# Patient Record
Sex: Female | Born: 1980 | Race: White | Hispanic: No | Smoking: Current every day smoker
Health system: Southern US, Community
[De-identification: ages and names within clinical notes are randomized; demographics above are authoritative.]

## PROBLEM LIST (undated history)

## (undated) DIAGNOSIS — F1010 Alcohol abuse, uncomplicated: Secondary | ICD-10-CM

## (undated) HISTORY — PX: NEPHRECTOMY TRANSPLANTED ORGAN: SUR880

## (undated) HISTORY — PX: MITRAL VALVE REPLACEMENT: SHX147

---

## 2020-02-29 NOTE — Progress Notes (Deleted)
Cardiology Office Note:   Date:  02/29/2020  NAME:  Leah Roth    MRN: [id number] DOB:  27-Oct-1980   PCP:  No primary care provider on file.  Cardiologist:  No primary care provider on file.  Electrophysiologist:  None   Referring MD: Shanna Cisco, NP   No chief complaint on file. ***  History of Present Illness:   Leah Roth is a 40 y.o. female with a hx of ESRD s/p transplant, mechanical MVR who is being seen today for the evaluation of MVR at the request of Cullop, Adele Barthel, NP.  Problem List 1. 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 MV endocarditis s/p mechanical MVR  -MG 2017 8.8 mmHG  Past Medical History: No past medical history on file.  Past Surgical History: *** The histories are not reviewed yet. Please review them in the "History" navigator section and refresh this SmartLink.  Current Medications: No outpatient medications have been marked as taking for the 03/02/20 encounter (Appointment) with O'Neal, Ronnald Ramp, MD.     Allergies:    Patient has no allergy information on record.   Social History: Social History   Socioeconomic History  . Marital status: Not on file    Spouse name: Not on file  . Number of children: Not on file  . Years of education: Not on file  . Highest education level: Not on file  Occupational History  . Not on file  Tobacco Use  . Smoking status: Not on file  . Smokeless tobacco: Not on file  Substance and Sexual Activity  . Alcohol use: Not on file  . Drug use: Not on file  . Sexual activity: Not on file  Other Topics Concern  . Not on file  Social History Narrative  . Not on file   Social Determinants of Health   Financial Resource Strain: Not on file  Food Insecurity: Not on file  Transportation Needs: Not on file  Physical Activity: Not on file  Stress: Not on file  Social Connections: Not on file     Family History: The patient's ***family history is not on file.  ROS:   All  other ROS reviewed and negative. Pertinent positives noted in the HPI.     EKGs/Labs/Other Studies Reviewed:   The following studies were personally reviewed by me today:  EKG:  EKG is *** ordered today.  The ekg ordered today demonstrates ***, and was personally reviewed by me.   TTE 07/26/2015  SUMMARY  The left ventricular size is normal.  The left ventricular wall motion is normal.  The right ventricle is normal size.  The right ventricular systolic function is normal.  The left atrial size is normal.   Right atrial size is normal.  Structurally normal aortic valve.  Mitral regurgitation present but unable to estimate severity due to  prosthetic valve shadowing  There is a bi-leaflet ([employer]) mechanical prosthesis  There is trace tricuspid regurgitation.  Structurally normal pulmonic valve.  Trace pulmonic valvular regurgitation.  There is no pericardial effusion.  Mild increase in the transmitral gradient relative to the prior study from  02/08/12.    Recent Labs: No results found for requested labs within last 8760 hours.   Recent Lipid Panel No results found for: CHOL, TRIG, HDL, CHOLHDL, VLDL, LDLCALC, LDLDIRECT  Physical Exam:   VS:  There were no vitals taken for this visit.   Wt Readings from Last 3 Encounters:  No data found for [name]  General: Well nourished, well developed, in no acute distress Head: Atraumatic, normal size  Eyes: PEERLA, EOMI  Neck: Supple, no JVD Endocrine: No thryomegaly Cardiac: Normal S1, S2; RRR; no murmurs, rubs, or gallops Lungs: Clear to auscultation bilaterally, no wheezing, rhonchi or rales  Abd: Soft, nontender, no hepatomegaly  Ext: No edema, pulses 2+ Musculoskeletal: No deformities, BUE and BLE strength normal and equal Skin: Warm and dry, no rashes   Neuro: Alert and oriented to person, place, time, and situation, CNII-XII grossly intact, no focal deficits  Psych: Normal mood and affect   ASSESSMENT:   Leah Roth is  a 40 y.o. female who presents for the following: No diagnosis found.  PLAN:   There are no diagnoses linked to this encounter.  Disposition: No follow-ups on file.  Medication Adjustments/Labs and Tests Ordered: Current medicines are reviewed at length with the patient today.  Concerns regarding medicines are outlined above.  No orders of the defined types were placed in this encounter.  No orders of the defined types were placed in this encounter.   There are no Patient Instructions on file for this visit.   Time Spent with Patient: I have spent a total of *** minutes with patient reviewing hospital notes, telemetry, EKGs, labs and examining the patient as well as establishing an assessment and plan that was discussed with the patient.  > 50% of time was spent in direct patient care.  Signed, Lenna Gilford. Flora Lipps, MD [facility]  [address], [address] [place], [place] [address] [phone]  02/29/2020 4:58 PM

## 2020-03-02 ENCOUNTER — Ambulatory Visit: Payer: Medicare Other | Admitting: Cardiovascular Disease

## 2020-03-02 DIAGNOSIS — Z952 Presence of prosthetic heart valve: Secondary | ICD-10-CM

## 2020-03-15 ENCOUNTER — Encounter: Payer: Self-pay | Admitting: General Practice

## 2020-03-16 ENCOUNTER — Emergency Department (HOSPITAL_COMMUNITY): Payer: Medicare Other

## 2020-03-16 ENCOUNTER — Encounter (HOSPITAL_COMMUNITY): Payer: Self-pay

## 2020-03-16 ENCOUNTER — Other Ambulatory Visit: Payer: Self-pay

## 2020-03-16 ENCOUNTER — Inpatient Hospital Stay (HOSPITAL_COMMUNITY)
Admission: EM | Admit: 2020-03-16 | Discharge: 2020-03-19 | DRG: 948 | Disposition: A | Discharge status: Home / Self Care | Payer: Medicare Other | Attending: Internal Medicine | Admitting: Internal Medicine

## 2020-03-16 DIAGNOSIS — W19XXXA Unspecified fall, initial encounter: Secondary | ICD-10-CM

## 2020-03-16 DIAGNOSIS — R7401 Elevation of levels of liver transaminase levels: Secondary | ICD-10-CM | POA: Diagnosis present

## 2020-03-16 DIAGNOSIS — D62 Acute posthemorrhagic anemia: Secondary | ICD-10-CM | POA: Diagnosis present

## 2020-03-16 DIAGNOSIS — Z888 Allergy status to other drugs, medicaments and biological substances status: Secondary | ICD-10-CM

## 2020-03-16 DIAGNOSIS — F10229 Alcohol dependence with intoxication, unspecified: Secondary | ICD-10-CM | POA: Diagnosis present

## 2020-03-16 DIAGNOSIS — M87852 Other osteonecrosis, left femur: Secondary | ICD-10-CM | POA: Diagnosis present

## 2020-03-16 DIAGNOSIS — Z79899 Other long term (current) drug therapy: Secondary | ICD-10-CM

## 2020-03-16 DIAGNOSIS — Z20822 Contact with and (suspected) exposure to covid-19: Secondary | ICD-10-CM | POA: Diagnosis present

## 2020-03-16 DIAGNOSIS — F32A Depression, unspecified: Secondary | ICD-10-CM | POA: Diagnosis present

## 2020-03-16 DIAGNOSIS — F1010 Alcohol abuse, uncomplicated: Secondary | ICD-10-CM | POA: Diagnosis present

## 2020-03-16 DIAGNOSIS — Z952 Presence of prosthetic heart valve: Secondary | ICD-10-CM

## 2020-03-16 DIAGNOSIS — W109XXA Fall (on) (from) unspecified stairs and steps, initial encounter: Secondary | ICD-10-CM | POA: Diagnosis present

## 2020-03-16 DIAGNOSIS — Z91199 Patient's noncompliance with other medical treatment and regimen due to unspecified reason: Secondary | ICD-10-CM

## 2020-03-16 DIAGNOSIS — F17210 Nicotine dependence, cigarettes, uncomplicated: Secondary | ICD-10-CM | POA: Diagnosis present

## 2020-03-16 DIAGNOSIS — Z9119 Patient's noncompliance with other medical treatment and regimen: Secondary | ICD-10-CM

## 2020-03-16 DIAGNOSIS — R791 Abnormal coagulation profile: Principal | ICD-10-CM | POA: Diagnosis present

## 2020-03-16 DIAGNOSIS — M25461 Effusion, right knee: Secondary | ICD-10-CM | POA: Diagnosis present

## 2020-03-16 DIAGNOSIS — Y906 Blood alcohol level of 120-199 mg/100 ml: Secondary | ICD-10-CM | POA: Diagnosis present

## 2020-03-16 DIAGNOSIS — Z96641 Presence of right artificial hip joint: Secondary | ICD-10-CM | POA: Diagnosis present

## 2020-03-16 DIAGNOSIS — G319 Degenerative disease of nervous system, unspecified: Secondary | ICD-10-CM | POA: Diagnosis present

## 2020-03-16 DIAGNOSIS — G621 Alcoholic polyneuropathy: Secondary | ICD-10-CM | POA: Diagnosis present

## 2020-03-16 DIAGNOSIS — Z5329 Procedure and treatment not carried out because of patient's decision for other reasons: Secondary | ICD-10-CM | POA: Diagnosis present

## 2020-03-16 DIAGNOSIS — Y92009 Unspecified place in unspecified non-institutional (private) residence as the place of occurrence of the external cause: Secondary | ICD-10-CM

## 2020-03-16 DIAGNOSIS — Z940 Kidney transplant status: Secondary | ICD-10-CM

## 2020-03-16 DIAGNOSIS — S0003XA Contusion of scalp, initial encounter: Secondary | ICD-10-CM | POA: Diagnosis present

## 2020-03-16 DIAGNOSIS — N3001 Acute cystitis with hematuria: Secondary | ICD-10-CM

## 2020-03-16 DIAGNOSIS — Z7901 Long term (current) use of anticoagulants: Secondary | ICD-10-CM

## 2020-03-16 DIAGNOSIS — T07XXXA Unspecified multiple injuries, initial encounter: Secondary | ICD-10-CM

## 2020-03-16 DIAGNOSIS — B9620 Unspecified Escherichia coli [E. coli] as the cause of diseases classified elsewhere: Secondary | ICD-10-CM | POA: Diagnosis present

## 2020-03-16 HISTORY — DX: Alcohol abuse, uncomplicated: F10.10

## 2020-03-16 LAB — CBC WITH DIFFERENTIAL/PLATELET
Abs Immature Granulocytes: 0.06 10*3/uL (ref 0.00–0.07)
Basophils Absolute: 0 10*3/uL (ref 0.0–0.1)
Basophils Relative: 0 %
Eosinophils Absolute: 0.1 10*3/uL (ref 0.0–0.5)
Eosinophils Relative: 1 %
HCT: 26.2 % — ABNORMAL LOW (ref 36.0–46.0)
Hemoglobin: 8.8 g/dL — ABNORMAL LOW (ref 12.0–15.0)
Immature Granulocytes: 1 %
Lymphocytes Relative: 26 %
Lymphs Abs: 1.8 10*3/uL (ref 0.7–4.0)
MCH: 31.7 pg (ref 26.0–34.0)
MCHC: 33.6 g/dL (ref 30.0–36.0)
MCV: 94.2 fL (ref 80.0–100.0)
Monocytes Absolute: 0.6 10*3/uL (ref 0.1–1.0)
Monocytes Relative: 8 %
Neutro Abs: 4.4 10*3/uL (ref 1.7–7.7)
Neutrophils Relative %: 64 %
Platelets: 152 10*3/uL (ref 150–400)
RBC: 2.78 MIL/uL — ABNORMAL LOW (ref 3.87–5.11)
RDW: 20.1 % — ABNORMAL HIGH (ref 11.5–15.5)
WBC: 6.9 10*3/uL (ref 4.0–10.5)
nRBC: 0.4 % — ABNORMAL HIGH (ref 0.0–0.2)

## 2020-03-16 LAB — URINALYSIS, ROUTINE W REFLEX MICROSCOPIC
Bilirubin Urine: NEGATIVE
Glucose, UA: NEGATIVE mg/dL
Ketones, ur: 5 mg/dL — AB
Nitrite: NEGATIVE
Protein, ur: 100 mg/dL — AB
Specific Gravity, Urine: 1.018 (ref 1.005–1.030)
pH: 5 (ref 5.0–8.0)

## 2020-03-16 LAB — COMPREHENSIVE METABOLIC PANEL
ALT: 81 U/L — ABNORMAL HIGH (ref 0–44)
AST: 217 U/L — ABNORMAL HIGH (ref 15–41)
Albumin: 3.7 g/dL (ref 3.5–5.0)
Alkaline Phosphatase: 55 U/L (ref 38–126)
Anion gap: 15 (ref 5–15)
BUN: 17 mg/dL (ref 6–20)
CO2: 26 mmol/L (ref 22–32)
Calcium: 8.2 mg/dL — ABNORMAL LOW (ref 8.9–10.3)
Chloride: 97 mmol/L — ABNORMAL LOW (ref 98–111)
Creatinine, Ser: 0.9 mg/dL (ref 0.44–1.00)
GFR, Estimated: >60 mL/min (ref >60)
Glucose, Bld: 96 mg/dL (ref 70–99)
Potassium: 4.9 mmol/L (ref 3.5–5.1)
Sodium: 138 mmol/L (ref 135–145)
Total Bilirubin: 1.2 mg/dL (ref 0.3–1.2)
Total Protein: 6.9 g/dL (ref 6.5–8.1)

## 2020-03-16 LAB — PROTIME-INR
INR: >10 (ref 0.8–1.2)
Prothrombin Time: 89.5 seconds — ABNORMAL HIGH (ref 11.4–15.2)

## 2020-03-16 LAB — CK: Total CK: 283 U/L — ABNORMAL HIGH (ref 38–234)

## 2020-03-16 MED ORDER — OXYCODONE-ACETAMINOPHEN 5-325 MG PO TABS
1.0000 | ORAL_TABLET | Freq: Once | ORAL | Status: AC
  Administered 2020-03-16: 1 via ORAL
  Filled 2020-03-16: qty 1

## 2020-03-16 MED ORDER — THIAMINE HCL 100 MG/ML IJ SOLN: 100.0000 mg | Freq: Every day | INTRAMUSCULAR | Status: DC

## 2020-03-16 MED ORDER — LORAZEPAM 1 MG PO TABS: 0.0000 mg | ORAL_TABLET | Freq: Two times a day (BID) | ORAL | Status: DC

## 2020-03-16 MED ORDER — VITAMIN K1 10 MG/ML IJ SOLN
5.0000 mg | Freq: Once | INTRAMUSCULAR | Status: AC
  Administered 2020-03-17: 5 mg via SUBCUTANEOUS
  Filled 2020-03-16: qty 0.5

## 2020-03-16 MED ORDER — LORAZEPAM 2 MG/ML IJ SOLN: 0.0000 mg | Freq: Two times a day (BID) | INTRAMUSCULAR | Status: DC

## 2020-03-16 MED ORDER — THIAMINE HCL 100 MG PO TABS: 100.0000 mg | ORAL_TABLET | Freq: Every day | ORAL | Status: DC

## 2020-03-16 MED ORDER — LORAZEPAM 1 MG PO TABS: 0.0000 mg | ORAL_TABLET | Freq: Four times a day (QID) | ORAL | Status: DC

## 2020-03-16 MED ORDER — SODIUM CHLORIDE 0.9 % IV SOLN
1.0000 g | Freq: Once | INTRAVENOUS | Status: AC
  Administered 2020-03-17: 1 g via INTRAVENOUS
  Filled 2020-03-16: qty 10

## 2020-03-16 MED ORDER — LORAZEPAM 2 MG/ML IJ SOLN
0.0000 mg | Freq: Four times a day (QID) | INTRAMUSCULAR | Status: DC
  Administered 2020-03-17: 1 mg via INTRAVENOUS
  Administered 2020-03-17: 2 mg via INTRAVENOUS
  Filled 2020-03-16 (×3): qty 1

## 2020-03-16 MED ORDER — CHLORDIAZEPOXIDE HCL 25 MG PO CAPS
25.0000 mg | ORAL_CAPSULE | Freq: Once | ORAL | Status: AC
  Administered 2020-03-17: 25 mg via ORAL
  Filled 2020-03-16: qty 1

## 2020-03-16 NOTE — ED Notes (Signed)
INR > 10, provider notified.

## 2020-03-16 NOTE — ED Provider Notes (Addendum)
[facility]-EMERGENCY DEPT Provider Note   CSN: [id number] Arrival date & time: 03/16/20  1754     History Chief Complaint  Patient presents with   Knee Pain   Back Pain    Leah Roth is a 40 y.o. female.  HPI     40 year old female comes in with chief complaint of fall. Patient has history of alcoholism and is status post renal transplant from 5 years ago taking Prograf.  Patient also has valve replacement history and is on Coumadin.  She reports that she had a fall 5 days ago.  She fell because she lost balance, and she fell down about 10 or 15 stairs.  She does not think she lost consciousness.  Patient comes into the ER because over the last 2 days she has been having difficulty walking because of her pain.  Review of system is positive for dark urine, burning with urination.   As far as pain from the fall is concerned, patient is hurting all over.  She has headache, back pain, shoulder pain, bilateral lower extremity pain.  Past Medical History:  Diagnosis Date   Alcohol abuse     There are no problems to display for this patient.    OB History   No obstetric history on file.     History reviewed. No pertinent family history.  Social History   Tobacco Use   Smoking status: Current Every Day Smoker    Packs/day: 0.50    Types: Cigarettes   Smokeless tobacco: Never Used  Vaping Use   Vaping Use: Every day   Substances: Nicotine  Substance Use Topics   Alcohol use: Yes    Comment: drinks a fifth a day   Drug use: Never    Home Medications Prior to Admission medications   Medication Sig Start Date End Date Taking? Authorizing Provider  atorvastatin (LIPITOR) 20 MG tablet Take 20 mg by mouth daily. 02/16/20  Yes [provider]  buPROPion (WELLBUTRIN) 75 MG tablet Take 75 mg by mouth daily. 02/16/20  Yes [provider]  escitalopram (LEXAPRO) 20 MG tablet Take 20 mg by mouth daily. 02/12/20  Yes  [provider]  gabapentin (NEURONTIN) 300 MG capsule Take 1,200 mg by mouth 2 (two) times daily. 02/21/20  Yes [provider]  mirtazapine (REMERON) 30 MG tablet Take 30 mg by mouth at bedtime. 01/19/20  Yes [provider]  mycophenolate (MYFORTIC) 180 MG EC tablet Take 540 mg by mouth 2 (two) times daily. Take 3 tablets (540 mg) BID   Yes [provider]  tacrolimus (PROGRAF) 0.5 MG capsule Take 0.5 mg by mouth daily. Take along with 3 mg capsule=3.5 mg   Yes [provider]  tacrolimus (PROGRAF) 1 MG capsule Take 3 mg by mouth daily. Take along with 0.5 capsule=3.5 mg   Yes [provider]  warfarin (COUMADIN) 10 MG tablet Take 10 mg by mouth daily. 02/12/20  Yes [provider]  hydrOXYzine (VISTARIL) 50 MG capsule Take 50 mg by mouth 3 (three) times daily as needed for anxiety. 12/01/19   [provider]    Allergies    Ibuprofen  Review of Systems   Review of Systems  Constitutional: Positive for activity change.  Respiratory: Negative for shortness of breath.   Cardiovascular: Negative for chest pain.  Musculoskeletal: Positive for arthralgias and back pain.  Allergic/Immunologic: Positive for immunocompromised state.  Hematological: Bruises/bleeds easily.  All other systems reviewed and are negative.  Physical Exam Updated Vital Signs BP 117/88    Pulse 98    Temp 98.7 F (37.1 C) (Oral)    Resp 15    Ht 5\' 2"  (1.575 m)    Wt 49.9 kg    LMP 03/15/2020    SpO2 96%    BMI 20.12 kg/m   Physical Exam Vitals and nursing note reviewed.  Constitutional:      Appearance: She is well-developed.  HENT:     Head: Normocephalic and atraumatic.  Eyes:     Extraocular Movements: EOM normal.  Cardiovascular:     Rate and Rhythm: Normal rate.  Pulmonary:     Effort: Pulmonary effort is normal.  Abdominal:     General: Bowel sounds are normal.  Musculoskeletal:        General: Swelling, tenderness and signs  of injury present. No deformity.     Cervical back: Normal range of motion and neck supple.  Skin:    General: Skin is warm and dry.     Findings: Bruising present.     Comments: Diffuse ecchymosis over the extremities.  Neurological:     Mental Status: She is alert and oriented to person, place, and time.     ED Results / Procedures / Treatments   Labs (all labs ordered are listed, but only abnormal results are displayed) Labs Reviewed  COMPREHENSIVE METABOLIC PANEL - Abnormal; Notable for the following components:      Result Value   Chloride 97 (*)    Calcium 8.2 (*)    AST 217 (*)    ALT 81 (*)    All other components within normal limits  CBC WITH DIFFERENTIAL/PLATELET - Abnormal; Notable for the following components:   RBC 2.78 (*)    Hemoglobin 8.8 (*)    HCT 26.2 (*)    RDW 20.1 (*)    nRBC 0.4 (*)    All other components within normal limits  PROTIME-INR - Abnormal; Notable for the following components:   Prothrombin Time 89.5 (*)    INR >10.0 (*)    All other components within normal limits  CK - Abnormal; Notable for the following components:   Total CK 283 (*)    All other components within normal limits  SARS CORONAVIRUS 2 (TAT 6-24 HRS)  URINE CULTURE  URINALYSIS, ROUTINE W REFLEX MICROSCOPIC  ETHANOL  I-STAT BETA HCG BLOOD, ED ([facility], [facility], [facility] ONLY)    EKG None  Radiology DG Chest 2 View  Result Date: 03/16/2020 CLINICAL DATA:  Anterior chest pain after fall 5 days ago. EXAM: CHEST - 2 VIEW COMPARISON:  None. FINDINGS: The heart size and mediastinal contours are within normal limits. Prior mitral valve replacement. Normal pulmonary vascularity. No focal consolidation, pleural effusion, or pneumothorax. No acute osseous abnormality. Old bilateral clavicle fracture status post ORIF. IMPRESSION: No active cardiopulmonary disease. Electronically Signed   By: 03/18/2020 M.D.   On: 03/16/2020 21:11   DG Lumbar Spine Complete  Result Date:  03/16/2020 CLINICAL DATA:  Back pain after fall 5 days ago. EXAM: LUMBAR SPINE - COMPLETE 4+ VIEW COMPARISON:  None. FINDINGS: Five lumbar type vertebral bodies. No acute fracture or subluxation. Vertebral body heights are preserved. Alignment is normal. Intervertebral disc spaces are maintained. The sacroiliac joints are unremarkable. IMPRESSION: Negative. Electronically Signed   By: 03/18/2020 M.D.   On: 03/16/2020 21:12   DG Wrist Complete Right  Result Date: 03/16/2020 CLINICAL DATA:  Status post fall 5 days ago. EXAM:  RIGHT WRIST - COMPLETE 3+ VIEW COMPARISON:  None. FINDINGS: There is no evidence of fracture or dislocation. There is no evidence of arthropathy or other focal bone abnormality. Soft tissues are unremarkable. IMPRESSION: Negative. Electronically Signed   By: Aram Candela M.D.   On: 03/16/2020 19:41   DG Tibia/Fibula Left  Result Date: 03/16/2020 CLINICAL DATA:  Left lower leg pain after fall 5 days ago. EXAM: LEFT TIBIA AND FIBULA - 2 VIEW COMPARISON:  None. FINDINGS: There is no evidence of fracture or other focal bone lesions. Patchy sclerosis in the distal femur and proximal tibia consistent with chronic bone infarcts. Soft tissues are unremarkable. IMPRESSION: 1.  No acute osseous abnormality. Electronically Signed   By: Obie Dredge M.D.   On: 03/16/2020 21:14   DG Ankle Complete Left  Result Date: 03/16/2020 CLINICAL DATA:  Left ankle pain after fall 5 days ago. EXAM: LEFT ANKLE COMPLETE - 3+ VIEW COMPARISON:  None. FINDINGS: There is no evidence of fracture, dislocation, or joint effusion. There is no evidence of arthropathy or other focal bone abnormality. Soft tissues are unremarkable. IMPRESSION: Negative. Electronically Signed   By: Obie Dredge M.D.   On: 03/16/2020 21:13   CT Head Wo Contrast  Result Date: 03/16/2020 CLINICAL DATA:  Headache after fall 5 days ago. EXAM: CT HEAD WITHOUT CONTRAST TECHNIQUE: Contiguous axial images were obtained from the  base of the skull through the vertex without intravenous contrast. COMPARISON:  None. FINDINGS: Brain: No evidence of acute infarction, hemorrhage, hydrocephalus, extra-axial collection or mass lesion/mass effect. Age advanced mild cerebral atrophy. Vascular: No hyperdense vessel or unexpected calcification. Skull: Normal. Negative for fracture or focal lesion. Sinuses/Orbits: No acute finding. Other: Small left parietal scalp hematoma. IMPRESSION: 1. No acute intracranial abnormality. Small left parietal scalp hematoma. 2. Age advanced mild cerebral atrophy. Electronically Signed   By: Obie Dredge M.D.   On: 03/16/2020 21:07   DG Knee Complete 4 Views Right  Result Date: 03/16/2020 CLINICAL DATA:  Status post fall 5 days ago. EXAM: RIGHT KNEE - COMPLETE 4+ VIEW COMPARISON:  None. FINDINGS: No evidence of an acute fracture or dislocation. No evidence of arthropathy. Benign appearing curvilinear sclerotic areas are seen within the medullary portion of the distal right femoral shaft. A large joint effusion is noted. IMPRESSION: 1. Large joint effusion without evidence of acute osseous abnormality. Electronically Signed   By: Aram Candela M.D.   On: 03/16/2020 19:43   DG Hip Unilat W or Wo Pelvis 2-3 Views Right  Result Date: 03/16/2020 CLINICAL DATA:  Right hip pain.  Recent fall. EXAM: DG HIP (WITH OR WITHOUT PELVIS) 2-3V RIGHT COMPARISON:  None. FINDINGS: No acute fracture or dislocation. Prior right total hip arthroplasty. No evidence of hardware failure or loosening. Subchondral sclerosis in the left femoral head. Soft tissues are unremarkable. IMPRESSION: 1. No acute osseous abnormality. 2. Prior right total hip arthroplasty without evidence of hardware complication. 3. Left femoral head avascular necrosis. Electronically Signed   By: Obie Dredge M.D.   On: 03/16/2020 21:09    Procedures .Critical Care Performed by: Derwood Kaplan, MD Authorized by: Derwood Kaplan, MD   Critical  care provider statement:    Critical care time (minutes):  36   Critical care was necessary to treat or prevent imminent or life-threatening deterioration of the following conditions: Critically elevated INR.   Critical care was time spent personally by me on the following activities:  Discussions with consultants, evaluation of patient's response to treatment, examination  of patient, ordering and performing treatments and interventions, ordering and review of laboratory studies, ordering and review of radiographic studies, pulse oximetry, re-evaluation of patient's condition, obtaining history from patient or surrogate and review of old charts     Medications Ordered in ED Medications  phytonadione (VITAMIN K) SQ injection 5 mg (has no administration in time range)  LORazepam (ATIVAN) injection 0-4 mg (has no administration in time range)    Or  LORazepam (ATIVAN) tablet 0-4 mg (has no administration in time range)  LORazepam (ATIVAN) injection 0-4 mg (has no administration in time range)    Or  LORazepam (ATIVAN) tablet 0-4 mg (has no administration in time range)  thiamine tablet 100 mg (has no administration in time range)    Or  thiamine (B-1) injection 100 mg (has no administration in time range)  chlordiazePOXIDE (LIBRIUM) capsule 25 mg (has no administration in time range)  cefTRIAXone (ROCEPHIN) 1 g in sodium chloride 0.9 % 100 mL IVPB (has no administration in time range)  oxyCODONE-acetaminophen (PERCOCET/ROXICET) 5-325 MG per tablet 1 tablet (1 tablet Oral Given 03/16/20 2117)  oxyCODONE-acetaminophen (PERCOCET/ROXICET) 5-325 MG per tablet 1 tablet (1 tablet Oral Given 03/16/20 2240)    ED Course  I have reviewed the triage vital signs and the nursing notes.  Pertinent labs & imaging results that were available during my care of the patient were reviewed by me and considered in my medical decision making (see chart for details).  Clinical Course as of 03/16/20 2339  Tue Mar 16, 2020  2339 INR(!!): >10.0 INR is greater than 10.  We will give her vitamin K subcu.  She will need admission to the hospital as she is unable to ambulate properly and is a high risk for fall. Unfortunately, she is an alcoholic and would likely go through withdrawals while she is in the hospital.  CIWA protocol initiated. [AN]    Clinical Course User Index [AN] Derwood Kaplan, MD   MDM Rules/Calculators/A&P                          40 year old female comes in a chief complaint of fall.  She is also having some urinary discomfort.  Patient has history of renal transplant and is on Prograf.  She also has v valve replacement history and is on Coumadin.  The fall was 5 days ago.  She is reporting headaches, feeling tired/sleepy.  She is also having difficulty walking because of her pain now.  We have ordered appropriate radiographs and a CT head, they are negative for any acute findings.  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  Patient has been made aware of this finding.  She does indicate that she has been having off-and-on pain in her left groin area.  She has history of right-sided hip replacement and we have advised her to follow-up with her orthopedist for it.  On reassessment, patient indicates that she is having some urinary discomfort, dark urine.  We will not only screen her for UTI but also check her INR.  12:10 AM  - unable to ambulate - inr > 10 - alcoholic - high fall risk. - no follow up for inr - coumadin is for valve replacement - need to ensure it remains in therapeutic level to prevent thrombo-embolic risk  Final Clinical Impression(s) / ED Diagnoses Final diagnoses:  Supratherapeutic INR  Multiple contusions  Acute cystitis with hematuria    Rx / DC  Orders ED Discharge Orders    None       Derwood KaplanNanavati, Jariyah Hackley, MD 03/16/20 [id number]    Derwood KaplanNanavati, Amor Hyle, MD 03/17/20 [phone]

## 2020-03-16 NOTE — ED Triage Notes (Addendum)
Per EMS- patient was picked up from a hotel where she is living. Patient c/o middle low back pain and right knee pain with bruising and a small laceration.  Patient may have had a fall 5 days ago . Patient has bruising to her neck, right arm, and a hematoma to her head. Patient is on Coumadin. Patient reports that she is alcoholic and drinks daily.  In triage, patient states she fell down 2 flights of concrete stairs. Patient states she hit her head and also c/o right wrist pain.

## 2020-03-17 ENCOUNTER — Encounter (HOSPITAL_COMMUNITY): Payer: Self-pay | Admitting: Internal Medicine

## 2020-03-17 DIAGNOSIS — Z79899 Other long term (current) drug therapy: Secondary | ICD-10-CM | POA: Diagnosis not present

## 2020-03-17 DIAGNOSIS — W19XXXA Unspecified fall, initial encounter: Secondary | ICD-10-CM

## 2020-03-17 DIAGNOSIS — F17210 Nicotine dependence, cigarettes, uncomplicated: Secondary | ICD-10-CM | POA: Diagnosis present

## 2020-03-17 DIAGNOSIS — R791 Abnormal coagulation profile: Secondary | ICD-10-CM | POA: Diagnosis present

## 2020-03-17 DIAGNOSIS — Z96641 Presence of right artificial hip joint: Secondary | ICD-10-CM | POA: Diagnosis present

## 2020-03-17 DIAGNOSIS — Z7901 Long term (current) use of anticoagulants: Secondary | ICD-10-CM | POA: Diagnosis not present

## 2020-03-17 DIAGNOSIS — Y906 Blood alcohol level of 120-199 mg/100 ml: Secondary | ICD-10-CM | POA: Diagnosis present

## 2020-03-17 DIAGNOSIS — Z940 Kidney transplant status: Secondary | ICD-10-CM | POA: Diagnosis not present

## 2020-03-17 DIAGNOSIS — M25461 Effusion, right knee: Secondary | ICD-10-CM | POA: Diagnosis present

## 2020-03-17 DIAGNOSIS — Z91199 Patient's noncompliance with other medical treatment and regimen due to unspecified reason: Secondary | ICD-10-CM

## 2020-03-17 DIAGNOSIS — Z20822 Contact with and (suspected) exposure to covid-19: Secondary | ICD-10-CM | POA: Diagnosis present

## 2020-03-17 DIAGNOSIS — M87852 Other osteonecrosis, left femur: Secondary | ICD-10-CM | POA: Diagnosis present

## 2020-03-17 DIAGNOSIS — Z9119 Patient's noncompliance with other medical treatment and regimen: Secondary | ICD-10-CM | POA: Diagnosis not present

## 2020-03-17 DIAGNOSIS — S0003XA Contusion of scalp, initial encounter: Secondary | ICD-10-CM | POA: Diagnosis present

## 2020-03-17 DIAGNOSIS — Z888 Allergy status to other drugs, medicaments and biological substances status: Secondary | ICD-10-CM | POA: Diagnosis not present

## 2020-03-17 DIAGNOSIS — F10229 Alcohol dependence with intoxication, unspecified: Secondary | ICD-10-CM | POA: Diagnosis present

## 2020-03-17 DIAGNOSIS — Y92009 Unspecified place in unspecified non-institutional (private) residence as the place of occurrence of the external cause: Secondary | ICD-10-CM

## 2020-03-17 DIAGNOSIS — D62 Acute posthemorrhagic anemia: Secondary | ICD-10-CM | POA: Diagnosis present

## 2020-03-17 DIAGNOSIS — G319 Degenerative disease of nervous system, unspecified: Secondary | ICD-10-CM | POA: Diagnosis present

## 2020-03-17 DIAGNOSIS — Z952 Presence of prosthetic heart valve: Secondary | ICD-10-CM

## 2020-03-17 DIAGNOSIS — F1010 Alcohol abuse, uncomplicated: Secondary | ICD-10-CM | POA: Diagnosis not present

## 2020-03-17 DIAGNOSIS — B9620 Unspecified Escherichia coli [E. coli] as the cause of diseases classified elsewhere: Secondary | ICD-10-CM | POA: Diagnosis present

## 2020-03-17 DIAGNOSIS — Z5329 Procedure and treatment not carried out because of patient's decision for other reasons: Secondary | ICD-10-CM | POA: Diagnosis present

## 2020-03-17 DIAGNOSIS — N3001 Acute cystitis with hematuria: Secondary | ICD-10-CM | POA: Diagnosis present

## 2020-03-17 DIAGNOSIS — R7401 Elevation of levels of liver transaminase levels: Secondary | ICD-10-CM | POA: Diagnosis present

## 2020-03-17 DIAGNOSIS — W109XXA Fall (on) (from) unspecified stairs and steps, initial encounter: Secondary | ICD-10-CM | POA: Diagnosis present

## 2020-03-17 DIAGNOSIS — F32A Depression, unspecified: Secondary | ICD-10-CM | POA: Diagnosis present

## 2020-03-17 DIAGNOSIS — G621 Alcoholic polyneuropathy: Secondary | ICD-10-CM | POA: Diagnosis present

## 2020-03-17 LAB — CBC WITH DIFFERENTIAL/PLATELET
Abs Immature Granulocytes: 0.03 10*3/uL (ref 0.00–0.07)
Basophils Absolute: 0 10*3/uL (ref 0.0–0.1)
Basophils Relative: 0 %
Eosinophils Absolute: 0.1 10*3/uL (ref 0.0–0.5)
Eosinophils Relative: 1 %
HCT: 24.6 % — ABNORMAL LOW (ref 36.0–46.0)
Hemoglobin: 8.1 g/dL — ABNORMAL LOW (ref 12.0–15.0)
Immature Granulocytes: 1 %
Lymphocytes Relative: 19 %
Lymphs Abs: 1.1 10*3/uL (ref 0.7–4.0)
MCH: 31.3 pg (ref 26.0–34.0)
MCHC: 32.9 g/dL (ref 30.0–36.0)
MCV: 95 fL (ref 80.0–100.0)
Monocytes Absolute: 0.4 10*3/uL (ref 0.1–1.0)
Monocytes Relative: 7 %
Neutro Abs: 3.9 10*3/uL (ref 1.7–7.7)
Neutrophils Relative %: 72 %
Platelets: 99 10*3/uL — ABNORMAL LOW (ref 150–400)
RBC: 2.59 MIL/uL — ABNORMAL LOW (ref 3.87–5.11)
RDW: 19.9 % — ABNORMAL HIGH (ref 11.5–15.5)
WBC: 5.4 10*3/uL (ref 4.0–10.5)
nRBC: 0.4 % — ABNORMAL HIGH (ref 0.0–0.2)

## 2020-03-17 LAB — COMPREHENSIVE METABOLIC PANEL
ALT: 71 U/L — ABNORMAL HIGH (ref 0–44)
ALT: 70 U/L — ABNORMAL HIGH (ref 0–44)
AST: 188 U/L — ABNORMAL HIGH (ref 15–41)
AST: 156 U/L — ABNORMAL HIGH (ref 15–41)
Albumin: 3.7 g/dL (ref 3.5–5.0)
Albumin: 3.7 g/dL (ref 3.5–5.0)
Alkaline Phosphatase: 49 U/L (ref 38–126)
Alkaline Phosphatase: 50 U/L (ref 38–126)
Anion gap: 13 (ref 5–15)
Anion gap: 14 (ref 5–15)
BUN: 19 mg/dL (ref 6–20)
BUN: 18 mg/dL (ref 6–20)
CO2: 27 mmol/L (ref 22–32)
CO2: 26 mmol/L (ref 22–32)
Calcium: 8.2 mg/dL — ABNORMAL LOW (ref 8.9–10.3)
Calcium: 8.5 mg/dL — ABNORMAL LOW (ref 8.9–10.3)
Chloride: 97 mmol/L — ABNORMAL LOW (ref 98–111)
Chloride: 97 mmol/L — ABNORMAL LOW (ref 98–111)
Creatinine, Ser: 0.77 mg/dL (ref 0.44–1.00)
Creatinine, Ser: 0.61 mg/dL (ref 0.44–1.00)
GFR, Estimated: >60 mL/min (ref >60)
GFR, Estimated: >60 mL/min (ref >60)
Glucose, Bld: 141 mg/dL — ABNORMAL HIGH (ref 70–99)
Glucose, Bld: 103 mg/dL — ABNORMAL HIGH (ref 70–99)
Potassium: 4.1 mmol/L (ref 3.5–5.1)
Potassium: 3.6 mmol/L (ref 3.5–5.1)
Sodium: 137 mmol/L (ref 135–145)
Sodium: 137 mmol/L (ref 135–145)
Total Bilirubin: 0.7 mg/dL (ref 0.3–1.2)
Total Bilirubin: 1 mg/dL (ref 0.3–1.2)
Total Protein: 6.6 g/dL (ref 6.5–8.1)
Total Protein: 6.7 g/dL (ref 6.5–8.1)

## 2020-03-17 LAB — PROTIME-INR
INR: >10 (ref 0.8–1.2)
INR: >10 (ref 0.8–1.2)
INR: 4.5 (ref 0.8–1.2)
Prothrombin Time: 84.5 seconds — ABNORMAL HIGH (ref 11.4–15.2)
Prothrombin Time: 85.6 seconds — ABNORMAL HIGH (ref 11.4–15.2)
Prothrombin Time: 41.4 seconds — ABNORMAL HIGH (ref 11.4–15.2)

## 2020-03-17 LAB — ETHANOL: Alcohol, Ethyl (B): 189 mg/dL — ABNORMAL HIGH (ref <10)

## 2020-03-17 LAB — I-STAT BETA HCG BLOOD, ED (MC, WL, AP ONLY): I-stat hCG, quantitative: <5 m[IU]/mL (ref <5)

## 2020-03-17 LAB — HIV ANTIBODY (ROUTINE TESTING W REFLEX): HIV Screen 4th Generation wRfx: NONREACTIVE

## 2020-03-17 LAB — SARS CORONAVIRUS 2 (TAT 6-24 HRS): SARS Coronavirus 2: NEGATIVE

## 2020-03-17 MED ORDER — MYCOPHENOLATE SODIUM 180 MG PO TBEC
540.0000 mg | DELAYED_RELEASE_TABLET | Freq: Two times a day (BID) | ORAL | Status: DC
  Administered 2020-03-17 – 2020-03-19 (×6): 540 mg via ORAL
  Filled 2020-03-17 (×7): qty 3

## 2020-03-17 MED ORDER — POLYETHYLENE GLYCOL 3350 17 G PO PACK: 17.0000 g | PACK | Freq: Every day | ORAL | Status: DC | PRN

## 2020-03-17 MED ORDER — ONDANSETRON HCL 4 MG PO TABS: 4.0000 mg | ORAL_TABLET | Freq: Four times a day (QID) | ORAL | Status: DC | PRN

## 2020-03-17 MED ORDER — SODIUM CHLORIDE 0.9 % IV SOLN
1.0000 g | INTRAVENOUS | Status: DC
  Administered 2020-03-17 – 2020-03-18 (×2): 1 g via INTRAVENOUS
  Filled 2020-03-17 (×2): qty 1

## 2020-03-17 MED ORDER — TACROLIMUS 1 MG PO CAPS: 3.5000 mg | ORAL_CAPSULE | Freq: Every day | ORAL | Status: DC

## 2020-03-17 MED ORDER — CHLORDIAZEPOXIDE HCL 25 MG PO CAPS
25.0000 mg | ORAL_CAPSULE | Freq: Four times a day (QID) | ORAL | Status: AC
  Administered 2020-03-17 – 2020-03-18 (×3): 25 mg via ORAL
  Filled 2020-03-17 (×2): qty 5
  Filled 2020-03-17: qty 1

## 2020-03-17 MED ORDER — CHLORDIAZEPOXIDE HCL 25 MG PO CAPS
25.0000 mg | ORAL_CAPSULE | Freq: Three times a day (TID) | ORAL | Status: AC
  Administered 2020-03-18 – 2020-03-19 (×3): 25 mg via ORAL
  Filled 2020-03-17 (×4): qty 1

## 2020-03-17 MED ORDER — HYDROXYZINE HCL 25 MG PO TABS
25.0000 mg | ORAL_TABLET | Freq: Four times a day (QID) | ORAL | Status: DC | PRN
  Administered 2020-03-18 – 2020-03-19 (×2): 25 mg via ORAL
  Filled 2020-03-17 (×2): qty 1

## 2020-03-17 MED ORDER — METHOCARBAMOL 500 MG PO TABS
500.0000 mg | ORAL_TABLET | Freq: Three times a day (TID) | ORAL | Status: DC | PRN
  Administered 2020-03-17 – 2020-03-19 (×3): 500 mg via ORAL
  Filled 2020-03-17 (×3): qty 1

## 2020-03-17 MED ORDER — LOPERAMIDE HCL 2 MG PO CAPS
2.0000 mg | ORAL_CAPSULE | ORAL | Status: DC | PRN
Start: 2020-03-17 — End: 2020-03-19

## 2020-03-17 MED ORDER — SODIUM CHLORIDE 0.45 % IV SOLN: INTRAVENOUS | Status: DC

## 2020-03-17 MED ORDER — MIRTAZAPINE 15 MG PO TABS
15.0000 mg | ORAL_TABLET | Freq: Once | ORAL | Status: AC
  Administered 2020-03-17: 15 mg via ORAL
  Filled 2020-03-17: qty 1

## 2020-03-17 MED ORDER — ONDANSETRON HCL 4 MG/2ML IJ SOLN
4.0000 mg | Freq: Four times a day (QID) | INTRAMUSCULAR | Status: DC | PRN
  Administered 2020-03-17: 4 mg via INTRAVENOUS
  Filled 2020-03-17: qty 2

## 2020-03-17 MED ORDER — NICOTINE 21 MG/24HR TD PT24
21.0000 mg | MEDICATED_PATCH | Freq: Every day | TRANSDERMAL | Status: DC
  Administered 2020-03-17 – 2020-03-19 (×3): 21 mg via TRANSDERMAL
  Filled 2020-03-17 (×3): qty 1

## 2020-03-17 MED ORDER — THIAMINE HCL 100 MG PO TABS
100.0000 mg | ORAL_TABLET | Freq: Every day | ORAL | Status: DC
  Administered 2020-03-17 – 2020-03-19 (×3): 100 mg via ORAL
  Filled 2020-03-17 (×3): qty 1

## 2020-03-17 MED ORDER — OXYCODONE HCL 5 MG PO TABS: 5.0000 mg | ORAL_TABLET | ORAL | Status: DC | PRN

## 2020-03-17 MED ORDER — GABAPENTIN 300 MG PO CAPS
300.0000 mg | ORAL_CAPSULE | Freq: Once | ORAL | Status: AC
  Administered 2020-03-17: 300 mg via ORAL
  Filled 2020-03-17: qty 1

## 2020-03-17 MED ORDER — CHLORDIAZEPOXIDE HCL 25 MG PO CAPS: 25.0000 mg | ORAL_CAPSULE | Freq: Every day | ORAL | Status: DC

## 2020-03-17 MED ORDER — ATORVASTATIN CALCIUM 20 MG PO TABS
20.0000 mg | ORAL_TABLET | Freq: Every day | ORAL | Status: DC
  Administered 2020-03-17 – 2020-03-19 (×3): 20 mg via ORAL
  Filled 2020-03-17 (×3): qty 1

## 2020-03-17 MED ORDER — BISACODYL 5 MG PO TBEC
5.0000 mg | DELAYED_RELEASE_TABLET | Freq: Every day | ORAL | Status: DC | PRN
  Administered 2020-03-19: 5 mg via ORAL
  Filled 2020-03-17: qty 1

## 2020-03-17 MED ORDER — ADULT MULTIVITAMIN W/MINERALS CH
1.0000 | ORAL_TABLET | Freq: Every day | ORAL | Status: DC
  Administered 2020-03-17 – 2020-03-19 (×3): 1 via ORAL
  Filled 2020-03-17 (×3): qty 1

## 2020-03-17 MED ORDER — FOLIC ACID 1 MG PO TABS
1.0000 mg | ORAL_TABLET | Freq: Every day | ORAL | Status: DC
  Administered 2020-03-17 – 2020-03-19 (×3): 1 mg via ORAL
  Filled 2020-03-17 (×3): qty 1

## 2020-03-17 MED ORDER — ENSURE ENLIVE PO LIQD
237.0000 mL | Freq: Two times a day (BID) | ORAL | Status: DC
  Administered 2020-03-17 (×2): 237 mL via ORAL

## 2020-03-17 MED ORDER — TACROLIMUS 1 MG PO CAPS
3.5000 mg | ORAL_CAPSULE | Freq: Two times a day (BID) | ORAL | Status: DC
  Administered 2020-03-17 – 2020-03-19 (×6): 3.5 mg via ORAL
  Filled 2020-03-17 (×7): qty 1

## 2020-03-17 MED ORDER — SODIUM CHLORIDE 0.9 % IV SOLN
INTRAVENOUS | Status: DC | PRN
  Administered 2020-03-17: 250 mL via INTRAVENOUS

## 2020-03-17 MED ORDER — OXYCODONE-ACETAMINOPHEN 5-325 MG PO TABS
1.0000 | ORAL_TABLET | Freq: Once | ORAL | Status: AC
  Administered 2020-03-17: 1 via ORAL
  Filled 2020-03-17 (×2): qty 1

## 2020-03-17 MED ORDER — CHLORDIAZEPOXIDE HCL 25 MG PO CAPS
25.0000 mg | ORAL_CAPSULE | Freq: Four times a day (QID) | ORAL | Status: DC | PRN
Start: 2020-03-17 — End: 2020-03-19

## 2020-03-17 MED ORDER — OXYCODONE HCL 5 MG PO TABS
5.0000 mg | ORAL_TABLET | Freq: Four times a day (QID) | ORAL | Status: DC | PRN
  Administered 2020-03-17 – 2020-03-19 (×5): 5 mg via ORAL
  Filled 2020-03-17 (×5): qty 1

## 2020-03-17 MED ORDER — CHLORDIAZEPOXIDE HCL 25 MG PO CAPS: 25.0000 mg | ORAL_CAPSULE | ORAL | Status: DC

## 2020-03-17 MED ORDER — ESCITALOPRAM OXALATE 20 MG PO TABS
20.0000 mg | ORAL_TABLET | Freq: Every day | ORAL | Status: DC
  Administered 2020-03-17 – 2020-03-19 (×3): 20 mg via ORAL
  Filled 2020-03-17 (×3): qty 1

## 2020-03-17 NOTE — Plan of Care (Signed)
Pt requested I page MD for pain medicine, as she is in a lot of pain. I paged and received a STAT order. I went into room to give patient the pain medicine but she was extremely drowsy and sleeping. She would only waken to sternal stimulation. I decided that d/t her stat, I was not comfortable administering the pain medication. Will waste accordingly.

## 2020-03-17 NOTE — Plan of Care (Signed)
  Problem: Education: Goal: Knowledge of disease or condition will improve Outcome: Not Progressing Goal: Understanding of discharge needs will improve Outcome: Not Progressing   Problem: Health Behavior/Discharge Planning: Goal: Ability to identify changes in lifestyle to reduce recurrence of condition will improve Outcome: Not Progressing Goal: Identification of resources available to assist in meeting health care needs will improve Outcome: Not Progressing   Problem: Physical Regulation: Goal: Complications related to the disease process, condition or treatment will be avoided or minimized Outcome: Not Progressing   Problem: Safety: Goal: Ability to remain free from injury will improve Outcome: Not Progressing   

## 2020-03-17 NOTE — Progress Notes (Signed)
ANTICOAGULATION CONSULT NOTE - Initial Consult  Pharmacy Consult for warfarin Indication: valve replacement  Allergies  Allergen Reactions  . Ibuprofen     Bad For Kidneys    Patient Measurements: Height: 5\' 2"  (157.5 cm) Weight: 49.9 kg (110 lb) IBW/kg (Calculated) : 50.1   Vital Signs: Temp: 98.7 F (37.1 C) (02/22 1809) Temp Source: Oral (02/22 1809) BP: 117/88 (02/22 2300) Pulse Rate: 98 (02/22 2300)  Labs: Recent Labs    03/16/20 2240  HGB 8.8*  HCT 26.2*  PLT 152  LABPROT 89.5*  INR >10.0*  CREATININE 0.90  CKTOTAL 283*    Estimated Creatinine Clearance: 66.1 mL/min (by C-G formula based on SCr of 0.9 mg/dL).   Medical History: Past Medical History:  Diagnosis Date  . Alcohol abuse      Assessment: 40 year old female comes in with chief complaint of fall. Patient has history of alcoholism and is status post renal transplant from 5 years ago taking Prograf.  Patient also has valve replacement history and is on Coumadin.  Pharmacy consulted to dose warfarin. HD warfarin 10mg  daily, LD 2/21 @ 0900  03/17/2020 INR > 10 Vit K 5mg  SQ x1 Hgb 8.8, plts WNL   Goal of Therapy:  INR 2.5-3.5   Plan:  Hold warfarin Daily INR  3/21 RPh 03/17/2020, 1:50 AM

## 2020-03-17 NOTE — Progress Notes (Signed)
PROGRESS NOTE  Leah Roth ZOX:[id number] DOB: 05/08/1980 DOA: 03/16/2020 PCP: System, Provider Not In  HPI/Recap of past 24 hours: HPI from Dr Geraldo Pitter is a 40 y.o. 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 Pt extremely somnolent likely from ED meds but reports a mechanical fall down her apt stairs with head and face trauma 5 days ago. No LOC. Pt called EMS today as couldn't walk. Pt denies any fever or chills. Pt lives with husband. Doesn't have any children. Active smoker 2 ppd X 20 years. Drinks a fifth of vodka daily. Prior cocaine use. In the ED, 139/127, pulse 122, RR 23, SPO2 96% room air. 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 Right knee joint effusion without evidence of acute osseous abnormality. Right total hip arthroplasty without evidence of hardware complication. Head CT with small left parietal scalp hematoma.  Age advanced mild cerebral atrophy.  Patient admitted for further management    Today, patient with generalized weakness, multiple bruising noted, complaining of generalized pain all over her body, eyes any chest pain, abdominal pain, nausea/vomiting, fever/chills.    Assessment/Plan: Active Problems:   Supratherapeutic INR   ETOH abuse   Fall at home, initial encounter   Renal transplant recipient   H/O mitral valve replacement   Medical non-compliance   Mechanical fall from possible alcohol intoxication Trauma imaging showed left femoral head avascular necrosis, large right knee joint effusion without evidence of acute osseous abnormality. Right total hip arthroplasty without evidence of hardware complication Head CT with small left parietal scalp hematoma.  Age  advanced mild cerebral atrophy. Consider ortho consult  Pain management Fall precautions, PT  Supratherapeutic INR INR >10, repeat unchanged Noncompliant to follow-up S/p vitamin K [facility] following, frequent INR checks Restart warfarin when therapeutic range  Possible UTI Small leukocytes, many bacteria, WBC 21-50, large hemoglobin Urine culture pending Continue IV ceftriaxone  Alcohol abuse with possible withdrawal Transaminitis AST> ALT Drinks 1/5 of vodka daily EtOH level elevated on admission Reports Ativan causes hallucination, start Librium detox protocol Monitor closely Advised to quit alcohol  Status post renal transplant Continue with CellCept and Prograf  Depression Continue to Lexapro, hold Wellbutrin for now  Tobacco abuse Advised to quit Nicotine patch     Malnutrition Type:      Malnutrition Characteristics:      Nutrition Interventions:       Estimated body mass index is 20.12 kg/m as calculated from the following:   Height as of this encounter: 5\' 2"  (1.575 m).   Weight as of this encounter: 49.9 kg.     Code Status: Full  Family Communication: None at bedside  Disposition Plan: Status is: Inpatient  Remains inpatient appropriate because:Inpatient level of care appropriate due to severity of illness   Dispo: The patient is from: Home              Anticipated d/c is to: Home              Anticipated d/c date is: 2 days              Patient currently is not medically stable to d/c.   Difficult to place patient No  Consultants:  None  Procedures:  None  Antimicrobials:  Ceftriaxone  DVT prophylaxis: Warfarin, currently on hold as INR > 10   Objective: Vitals:   03/17/20 0748 03/17/20 0913 03/17/20 1010 03/17/20 1431  BP: 117/87 110/81 (!) 111/94 114/82  Pulse: (!) 101 (!) 117 (!) 121 (!) 117  Resp: 17  15   Temp:  98.4 F (36.9 C) 98.3 F (36.8 C) 99.3 F (37.4 C)  TempSrc:  Oral Oral Oral   SpO2: 93% 95% 97% 100%  Weight:      Height:        Intake/Output Summary (Last 24 hours) at 03/17/2020 1505 Last data filed at 03/17/2020 1015 Gross per 24 hour  Intake 1338.33 ml  Output --  Net 1338.33 ml   Filed Weights   03/16/20 1810  Weight: 49.9 kg    Exam:  General: NAD, left-sided scalp hematoma  Cardiovascular: S1, S2 present  Respiratory: CTAB  Abdomen: Soft, nontender, nondistended, bowel sounds present  Musculoskeletal: No bilateral pedal edema noted  Skin:  Multiple bruises noted all over extremities, back  Psychiatry: Normal mood   Data Reviewed: CBC: Recent Labs  Lab 03/16/20 2240 03/17/20 0512  WBC 6.9 5.4  NEUTROABS 4.4 3.9  HGB 8.8* 8.1*  HCT 26.2* 24.6*  MCV 94.2 95.0  PLT 152 99*   Basic Metabolic Panel: Recent Labs  Lab 03/16/20 2240 03/17/20 0136 03/17/20 0512  NA 138 137 137  K 4.9 4.1 3.6  CL 97* 97* 97*  CO2 26 27 26   GLUCOSE 96 141* 103*  BUN 17 19 18   CREATININE 0.90 0.77 0.61  CALCIUM 8.2* 8.2* 8.5*   GFR: Estimated Creatinine Clearance: 74.4 mL/min (by C-G formula based on SCr of 0.61 mg/dL). Liver Function Tests: Recent Labs  Lab 03/16/20 2240 03/17/20 0136 03/17/20 0512  AST 217* 188* 156*  ALT 81* 71* 70*  ALKPHOS 55 49 50  BILITOT 1.2 0.7 1.0  PROT 6.9 6.6 6.7  ALBUMIN 3.7 3.7 3.7   No results for input(s): LIPASE, AMYLASE in the last 168 hours. No results for input(s): AMMONIA in the last 168 hours. Coagulation Profile: Recent Labs  Lab 03/16/20 2240 03/17/20 0130 03/17/20 0512  INR >10.0* >10.0* >10.0*   Cardiac Enzymes: Recent Labs  Lab 03/16/20 2240  CKTOTAL 283*   BNP (last 3 results) No results for input(s): PROBNP in the last 8760 hours. HbA1C: No results for input(s): HGBA1C in the last 72 hours. CBG: No results for input(s): GLUCAP in the last 168 hours. Lipid Profile: No results for input(s): CHOL, HDL, LDLCALC, TRIG, CHOLHDL, LDLDIRECT in the last 72 hours. Thyroid  Function Tests: No results for input(s): TSH, T4TOTAL, FREET4, T3FREE, THYROIDAB in the last 72 hours. Anemia Panel: No results for input(s): VITAMINB12, FOLATE, FERRITIN, TIBC, IRON, RETICCTPCT in the last 72 hours. Urine analysis:    Component Value Date/Time   COLORURINE [place] (A) 03/16/2020 2330   APPEARANCEUR HAZY (A) 03/16/2020 2330   LABSPEC 1.018 03/16/2020 2330   PHURINE 5.0 03/16/2020 2330   GLUCOSEU NEGATIVE 03/16/2020 2330   HGBUR LARGE (A) 03/16/2020 2330   BILIRUBINUR NEGATIVE 03/16/2020 2330   KETONESUR 5 (A) 03/16/2020 2330   PROTEINUR 100 (A) 03/16/2020 2330   NITRITE NEGATIVE 03/16/2020 2330   LEUKOCYTESUR SMALL (A) 03/16/2020 2330   Sepsis Labs: @LABRCNTIP (procalcitonin:4,lacticidven:4)  ) Recent Results (from the past 240 hour(s))  SARS CORONAVIRUS 2 (TAT 6-24 HRS) Nasopharyngeal Nasopharyngeal Swab     Status: None   Collection Time: 03/16/20  11:31 PM   Specimen: Nasopharyngeal Swab  Result Value Ref Range Status   SARS Coronavirus 2 NEGATIVE NEGATIVE Final    Comment: (NOTE) SARS-CoV-2 target nucleic acids are NOT DETECTED.  The SARS-CoV-2 RNA is generally detectable in upper and lower respiratory specimens during the acute phase of infection. Negative results do not preclude SARS-CoV-2 infection, do not rule out co-infections with other pathogens, and should not be used as the sole basis for treatment or other patient management decisions. Negative results must be combined with clinical observations, patient history, and epidemiological information. The expected result is Negative.  Fact Sheet for Patients: HairSlick.no  Fact Sheet for Healthcare Providers: quierodirigir.com  This test is not yet approved or cleared by the [place] FDA and  has been authorized for detection and/or diagnosis of SARS-CoV-2 by FDA under an Emergency Use Authorization (EUA). This EUA will remain  in effect  (meaning this test can be used) for the duration of the COVID-19 declaration under Se ction 564(b)(1) of the Act, 21 U.S.C. section 360bbb-3(b)(1), unless the authorization is terminated or revoked sooner.  Performed at [facility], [address], [place], [place] [address]       Studies: DG Chest 2 View  Result Date: 03/16/2020 CLINICAL DATA:  Anterior chest pain after fall 5 days ago. EXAM: CHEST - 2 VIEW COMPARISON:  None. FINDINGS: The heart size and mediastinal contours are within normal limits. Prior mitral valve replacement. Normal pulmonary vascularity. No focal consolidation, pleural effusion, or pneumothorax. No acute osseous abnormality. Old bilateral clavicle fracture status post ORIF. IMPRESSION: No active cardiopulmonary disease. Electronically Signed   By: Obie Dredge M.D.   On: 03/16/2020 21:11   DG Lumbar Spine Complete  Result Date: 03/16/2020 CLINICAL DATA:  Back pain after fall 5 days ago. EXAM: LUMBAR SPINE - COMPLETE 4+ VIEW COMPARISON:  None. FINDINGS: Five lumbar type vertebral bodies. No acute fracture or subluxation. Vertebral body heights are preserved. Alignment is normal. Intervertebral disc spaces are maintained. The sacroiliac joints are unremarkable. IMPRESSION: Negative. Electronically Signed   By: Obie Dredge M.D.   On: 03/16/2020 21:12   DG Wrist Complete Right  Result Date: 03/16/2020 CLINICAL DATA:  Status post fall 5 days ago. EXAM: RIGHT WRIST - COMPLETE 3+ VIEW COMPARISON:  None. FINDINGS: There is no evidence of fracture or dislocation. There is no evidence of arthropathy or other focal bone abnormality. Soft tissues are unremarkable. IMPRESSION: Negative. Electronically Signed   By: Aram Candela M.D.   On: 03/16/2020 19:41   DG Tibia/Fibula Left  Result Date: 03/16/2020 CLINICAL DATA:  Left lower leg pain after fall 5 days ago. EXAM: LEFT TIBIA AND FIBULA - 2 VIEW COMPARISON:  None. FINDINGS: There is no evidence of fracture or  other focal bone lesions. Patchy sclerosis in the distal femur and proximal tibia consistent with chronic bone infarcts. Soft tissues are unremarkable. IMPRESSION: 1.  No acute osseous abnormality. Electronically Signed   By: Obie Dredge M.D.   On: 03/16/2020 21:14   DG Ankle Complete Left  Result Date: 03/16/2020 CLINICAL DATA:  Left ankle pain after fall 5 days ago. EXAM: LEFT ANKLE COMPLETE - 3+ VIEW COMPARISON:  None. FINDINGS: There is no evidence of fracture, dislocation, or joint effusion. There is no evidence of arthropathy or other focal bone abnormality. Soft tissues are unremarkable. IMPRESSION: Negative. Electronically Signed   By: Obie Dredge M.D.   On: 03/16/2020 21:13   CT Head Wo Contrast  Result Date:  03/16/2020 CLINICAL DATA:  Headache after fall 5 days ago. EXAM: CT HEAD WITHOUT CONTRAST TECHNIQUE: Contiguous axial images were obtained from the base of the skull through the vertex without intravenous contrast. COMPARISON:  None. FINDINGS: Brain: No evidence of acute infarction, hemorrhage, hydrocephalus, extra-axial collection or mass lesion/mass effect. Age advanced mild cerebral atrophy. Vascular: No hyperdense vessel or unexpected calcification. Skull: Normal. Negative for fracture or focal lesion. Sinuses/Orbits: No acute finding. Other: Small left parietal scalp hematoma. IMPRESSION: 1. No acute intracranial abnormality. Small left parietal scalp hematoma. 2. Age advanced mild cerebral atrophy. Electronically Signed   By: Obie Dredge M.D.   On: 03/16/2020 21:07   DG Knee Complete 4 Views Right  Result Date: 03/16/2020 CLINICAL DATA:  Status post fall 5 days ago. EXAM: RIGHT KNEE - COMPLETE 4+ VIEW COMPARISON:  None. FINDINGS: No evidence of an acute fracture or dislocation. No evidence of arthropathy. Benign appearing curvilinear sclerotic areas are seen within the medullary portion of the distal right femoral shaft. A large joint effusion is noted. IMPRESSION: 1.  Large joint effusion without evidence of acute osseous abnormality. Electronically Signed   By: Aram Candela M.D.   On: 03/16/2020 19:43   DG Hip Unilat W or Wo Pelvis 2-3 Views Right  Result Date: 03/16/2020 CLINICAL DATA:  Right hip pain.  Recent fall. EXAM: DG HIP (WITH OR WITHOUT PELVIS) 2-3V RIGHT COMPARISON:  None. FINDINGS: No acute fracture or dislocation. Prior right total hip arthroplasty. No evidence of hardware failure or loosening. Subchondral sclerosis in the left femoral head. Soft tissues are unremarkable. IMPRESSION: 1. No acute osseous abnormality. 2. Prior right total hip arthroplasty without evidence of hardware complication. 3. Left femoral head avascular necrosis. Electronically Signed   By: Obie Dredge M.D.   On: 03/16/2020 21:09    Scheduled Meds: . atorvastatin  20 mg Oral Daily  . chlordiazePOXIDE  25 mg Oral QID   Followed by  . [START ON 03/18/2020] chlordiazePOXIDE  25 mg Oral TID   Followed by  . [START ON 03/19/2020] chlordiazePOXIDE  25 mg Oral BH-qamhs   Followed by  . [START ON 03/20/2020] chlordiazePOXIDE  25 mg Oral Daily  . escitalopram  20 mg Oral Daily  . feeding supplement  237 mL Oral BID BM  . folic acid  1 mg Oral Daily  . multivitamin with minerals  1 tablet Oral Daily  . mycophenolate  540 mg Oral BID  . nicotine  21 mg Transdermal Daily  . tacrolimus  3.5 mg Oral BID  . thiamine  100 mg Oral Daily    Continuous Infusions: . [START ON 03/18/2020] cefTRIAXone (ROCEPHIN)  IV       LOS: 0 days     Briant Cedar, MD [facility]  If 7PM-7AM, please contact night-coverage [email or web address] 03/17/2020, 3:05 PM

## 2020-03-17 NOTE — Progress Notes (Signed)
Pt unable to stay awake long enough to sign form stating that she is aware her home medications will be taken to pharmacy until discharge. Pt did give consent to have nurses verify that her medications were taken to pharmacy and documented.  Val Eagle

## 2020-03-17 NOTE — Evaluation (Signed)
Physical Therapy Evaluation Patient Details Name: Leah Roth MRN: [id number] DOB: [birth year] Today's Date: 03/17/2020   History of Present Illness  Leah Roth is a 40 y.o. female with history of EtOH abuse,ESRD s/p live donor renal transplant 04/08/2013, s/p  mechanical fall down her apt stairs with head and face trauma and no LOC 5 days prior to admission.  She called EMS today as couldn't walk. She lives with husband. Doesn't have any children. Active smoker 2 ppd X2 20 years. Drinks a fifth of vodka daily. Prior cocaine use.  Clinical Impression  Pt admitted with above diagnosis.  Pt currently with functional limitations due to the deficits listed below (see PT Problem List). Pt will benefit from skilled PT to increase their independence and safety with mobility to allow discharge to the venue listed below.  Pt unsteady, but feel that her mobility should improve during stay and do not think she will need any follow up, but will continue to assess.     Follow Up Recommendations No PT follow up    Equipment Recommendations  None recommended by PT    Recommendations for Other Services       Precautions / Restrictions Precautions Precautions: Fall      Mobility  Bed Mobility Overal bed mobility: Modified Independent             General bed mobility comments: MOD I, but increased time and very guarded for her age.    Transfers Overall transfer level: Needs assistance Equipment used: 1 person hand held assist Transfers: Sit to/from Stand Sit to Stand: Min assist         General transfer comment: slight posterior sway upon standing  Ambulation/Gait Ambulation/Gait assistance: Min assist Gait Distance (Feet): 30 Feet Assistive device: 1 person hand held assist Gait Pattern/deviations: Decreased step length - right;Decreased step length - left;Ataxic     General Gait Details: slight ataxic gait and needing either HHA or the end of the bed for support. R knee  bothering her. Limited gait distance due to high INR and pt with vomit in her hair and awaiting a bath.  Stairs            Wheelchair Mobility    Modified Rankin (Stroke Patients Only)       Balance Overall balance assessment: Needs assistance           Standing balance-Leahy Scale: Poor                               Pertinent Vitals/Pain Pain Assessment: 0-10 Pain Score: 8  Pain Location: head, back, R knee Pain Descriptors / Indicators: Headache Pain Intervention(s): Limited activity within patient's tolerance;Ice applied;Monitored during session;Patient requesting pain meds-RN notified    Home Living Family/patient expects to be discharged to:: Private residence Living Arrangements: Spouse/significant other Available Help at Discharge: Family Type of Home: House Home Access: Stairs to enter   Secretary/administrator of Steps: 2 Home Layout: One level        Prior Function Level of Independence: Independent               Hand Dominance        Extremity/Trunk Assessment        Lower Extremity Assessment Lower Extremity Assessment: RLE deficits/detail RLE Deficits / Details: R knee with some swellling noted causing decreased ROM RLE: Unable to fully assess due to pain       Communication  Communication: No difficulties  Cognition Arousal/Alertness: Awake/alert Behavior During Therapy: WFL for tasks assessed/performed Overall Cognitive Status: Within Functional Limits for tasks assessed                                        General Comments      Exercises     Assessment/Plan    PT Assessment Patient needs continued PT services  PT Problem List Decreased activity tolerance;Decreased balance;Decreased range of motion;Pain       PT Treatment Interventions Gait training;Functional mobility training;Neuromuscular re-education;Balance training;Therapeutic exercise;Therapeutic activities    PT Goals  (Current goals can be found in the Care Plan section)  Acute Rehab PT Goals Patient Stated Goal: get a shower PT Goal Formulation: With patient Time For Goal Achievement: 03/31/20 Potential to Achieve Goals: Good    Frequency Min 3X/week   Barriers to discharge        Co-evaluation               AM-PAC PT "6 Clicks" Mobility  Outcome Measure Help needed turning from your back to your side while in a flat bed without using bedrails?: None Help needed moving from lying on your back to sitting on the side of a flat bed without using bedrails?: None Help needed moving to and from a bed to a chair (including a wheelchair)?: A Little Help needed standing up from a chair using your arms (e.g., wheelchair or bedside chair)?: A Little Help needed to walk in hospital room?: A Little Help needed climbing 3-5 steps with a railing? : A Little 6 Click Score: 20    End of Session   Activity Tolerance: Patient limited by pain Patient left: in bed;with call bell/phone within reach Nurse Communication: Mobility status;Patient requests pain meds PT Visit Diagnosis: Unsteadiness on feet (R26.81);History of falling (Z91.81);Other abnormalities of gait and mobility (R26.89);Pain Pain - Right/Left: Right Pain - part of body: Knee    Time: 1259-1320 PT Time Calculation (min) (ACUTE ONLY): 21 min   Charges:   PT Evaluation $PT Eval Low Complexity: 1 Low          Alric Geise L. Katrinka Blazing, [place] Pager [phone] 03/17/2020   Enzo Montgomery 03/17/2020, 1:45 PM

## 2020-03-17 NOTE — ED Provider Notes (Signed)
Care assumed from Dr. Rhunette Croft, patient supratherapeutic INR but no obvious signs of bleeding.  Also, bruises from a fall and urine suggestive of UTI.  Patient needs to be admitted to monitor INR and needs to get established with appropriate follow-up here.  Case is discussed with Dr. Earney Mallet of [facility], who agrees to admit the patient.   Dione Booze, MD 03/17/20 [phone]

## 2020-03-17 NOTE — ED Notes (Signed)
INR > 10 provider notified.

## 2020-03-17 NOTE — Progress Notes (Signed)
Pharmacy: anticoagulation  INR 4.5 after 5 mg SQ vitamin K.  Plan: Once INR <2.5, will initiate therapeutic LMWH for [facility] bridge given history of mechanical valve and resume warfarin  Herby Abraham, Pharm.D 03/17/2020 7:21 PM

## 2020-03-17 NOTE — Progress Notes (Signed)
Date and time results received: 03/17/20 6:14 AM  Test: INR Critical Value: >10.0  Name of Provider Notified: Evonnie Dawes  Orders Received? Or Actions Taken?: No new orders at this time.

## 2020-03-17 NOTE — Progress Notes (Signed)
Critical Value: INR 4.5 Provided notified:  B. Kyere at 1950 via Amion No orders as of yet. Val Eagle

## 2020-03-17 NOTE — H&P (Signed)
History and Physical    Rogue Pautler WUJ:[id number] DOB: 1981-01-16 DOA: 03/16/2020  PCP: System, Provider Not In    Patient coming from: Home  I have personally briefly reviewed patient's old medical records in [facility]  Chief Complaint: Fall  HPI: Leah Roth is a 40 y.o. female with history of EtOH abuse,ESRD s/p live donor renal transplant 04/08/2013, s/p mechanical MVR  Goal INR 2.5-3.5 chronically on coumadin followed by [facility] clinic presented to ED as couldn't walk post fall.   Pt extremely somnolent likely from ED meds but reports a mechanical fall down her apt stairs with head and face trauma 5 days ago. No LOC.   She called EMS today as couldn't walk. She denies any fever or chills.   She lives with husband. Doesn't have any children. Active smoker 2 ppd X2 20 years. Drinks a fifth of vodka daily. Prior cocaine use.    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  Left knee joint effusion without evidence of acute osseous abnormality.  Otherwise unremarkable.  -Head CT with small left parietal scalp hematoma.  Age advanced mild cerebral atrophy   ED meds: -Ceftriaxone 1 g IV once -Phytonadione subcu injection 5 mg x 1 -Librium 25 mg p.o. -Oxycodone 5/325 2 tablets  Review of Systems: As per HPI otherwise 10 point review of systems negative.    Past Medical History:  Diagnosis Date  . Alcohol abuse       reports that she has been smoking cigarettes. She has been smoking about 0.50 packs per day. She has never used smokeless tobacco. She reports current alcohol use. She reports that she does not use drugs.  Allergies  Allergen Reactions  . Ibuprofen      Bad For Kidneys      Prior to Admission medications   Medication Sig Start Date End Date Taking? Authorizing Provider  atorvastatin (LIPITOR) 20 MG tablet Take 20 mg by mouth daily. 02/16/20  Yes [provider]  buPROPion (WELLBUTRIN) 75 MG tablet Take 75 mg by mouth daily. 02/16/20  Yes [provider]  escitalopram (LEXAPRO) 20 MG tablet Take 20 mg by mouth daily. 02/12/20  Yes [provider]  gabapentin (NEURONTIN) 300 MG capsule Take 1,200 mg by mouth 2 (two) times daily. 02/21/20  Yes [provider]  mirtazapine (REMERON) 30 MG tablet Take 30 mg by mouth at bedtime. 01/19/20  Yes [provider]  mycophenolate (MYFORTIC) 180 MG EC tablet Take 540 mg by mouth 2 (two) times daily. Take 3 tablets (540 mg) BID   Yes [provider]  tacrolimus (PROGRAF) 0.5 MG capsule Take 0.5 mg by mouth daily. Take along with 3 mg capsule=3.5 mg   Yes [provider]  tacrolimus (PROGRAF) 1 MG capsule Take 3 mg by mouth daily. Take along with 0.5 capsule=3.5 mg   Yes [provider]  warfarin (COUMADIN) 10 MG tablet Take 10 mg by mouth daily. 02/12/20  Yes [provider]  hydrOXYzine (VISTARIL) 50 MG capsule Take 50 mg by mouth 3 (three) times daily as needed for anxiety. 12/01/19   [provider]    Physical Exam: Vitals:  03/16/20 2100 03/16/20 2200 03/16/20 2230 03/16/20 2300  BP: (!) 139/123 100/70 115/82 117/88  Pulse: 89 93 87 98  Resp: 17 (!) 23 15 15   Temp:      TempSrc:      SpO2: 96% 98% 95% 96%  Weight:      Height:        Constitutional: NAD, calm, comfortable Somnolent but arousable and responds to questions.   Vitals:   03/16/20 2100 03/16/20 2200 03/16/20 2230 03/16/20 2300  BP: (!) 139/123 100/70 115/82 117/88  Pulse: 89 93 87 98  Resp: 17 (!) 23 15 15   Temp:      TempSrc:      SpO2: 96% 98% 95% 96%  Weight:      Height:       Eyes: PERRL, lids and conjunctivae normal ENMT:  Mucous membranes are moist. Posterior pharynx clear of any exudate or lesions.Normal dentition.   right maxillary ecchymosis   Neck: normal, supple, no masses, no thyromegaly Respiratory: clear to auscultation bilaterally, no wheezing, no crackles. Normal respiratory effort. No accessory muscle use.  Cardiovascular: Regular rate and rhythm, no murmurs / rubs / gallops. No extremity edema. 2+ pedal pulses. No carotid bruits.  Abdomen: no tenderness, no masses palpated. No hepatosplenomegaly. Bowel sounds positive.  Musculoskeletal: no clubbing / cyanosis. No joint deformity upper and lower extremities. Good ROM, no contractures. Normal muscle tone.  Skin: no rashes, lesions, ulcers. No induration Neurologic: CN 2-12 grossly intact. Sensation intact, DTR normal. Strength 5/5 in all 4.  Psychiatric: Normal judgment and insight. Alert and oriented x 3. Normal mood.    Labs on Admission: I have personally reviewed following labs and imaging studies  CBC: Recent Labs  Lab 03/16/20 2240  WBC 6.9  NEUTROABS 4.4  HGB 8.8*  HCT 26.2*  MCV 94.2  PLT 152   Basic Metabolic Panel: Recent Labs  Lab 03/16/20 2240  NA 138  K 4.9  CL 97*  CO2 26  GLUCOSE 96  BUN 17  CREATININE 0.90  CALCIUM 8.2*   GFR: Estimated Creatinine Clearance: 66.1 mL/min (by C-G formula based on SCr of 0.9 mg/dL). Liver Function Tests: Recent Labs  Lab 03/16/20 2240  AST 217*  ALT 81*  ALKPHOS 55  BILITOT 1.2  PROT 6.9  ALBUMIN 3.7   No results for input(s): LIPASE, AMYLASE in the last 168 hours. No results for input(s): AMMONIA in the last 168 hours. Coagulation Profile: Recent Labs  Lab 03/16/20 2240  INR >10.0*   Cardiac Enzymes: Recent Labs  Lab 03/16/20 2240  CKTOTAL 283*   Urine analysis:    Component Value Date/Time   COLORURINE [name] (A) 03/16/2020 2330   APPEARANCEUR HAZY (A) 03/16/2020 2330   LABSPEC 1.018 03/16/2020 2330   PHURINE 5.0 03/16/2020 2330   GLUCOSEU NEGATIVE  03/16/2020 2330   HGBUR LARGE (A) 03/16/2020 2330   BILIRUBINUR NEGATIVE 03/16/2020 2330   KETONESUR 5 (A) 03/16/2020 2330   PROTEINUR 100 (A) 03/16/2020 2330   NITRITE NEGATIVE 03/16/2020 2330   LEUKOCYTESUR SMALL (A) 03/16/2020 2330    Radiological Exams on Admission: DG Chest 2 View  Result Date: 03/16/2020 CLINICAL DATA:  Anterior chest pain after fall 5 days ago. EXAM: CHEST - 2 VIEW COMPARISON:  None. FINDINGS: The heart size and mediastinal contours are within normal limits. Prior mitral valve replacement. Normal pulmonary vascularity. No focal consolidation, pleural effusion, or pneumothorax. No acute osseous abnormality. Old bilateral clavicle fracture status post ORIF. IMPRESSION: No active cardiopulmonary  disease. Electronically Signed   By: Obie Dredge M.D.   On: 03/16/2020 21:11   DG Lumbar Spine Complete  Result Date: 03/16/2020 CLINICAL DATA:  Back pain after fall 5 days ago. EXAM: LUMBAR SPINE - COMPLETE 4+ VIEW COMPARISON:  None. FINDINGS: Five lumbar type vertebral bodies. No acute fracture or subluxation. Vertebral body heights are preserved. Alignment is normal. Intervertebral disc spaces are maintained. The sacroiliac joints are unremarkable. IMPRESSION: Negative. Electronically Signed   By: Obie Dredge M.D.   On: 03/16/2020 21:12   DG Wrist Complete Right  Result Date: 03/16/2020 CLINICAL DATA:  Status post fall 5 days ago. EXAM: RIGHT WRIST - COMPLETE 3+ VIEW COMPARISON:  None. FINDINGS: There is no evidence of fracture or dislocation. There is no evidence of arthropathy or other focal bone abnormality. Soft tissues are unremarkable. IMPRESSION: Negative. Electronically Signed   By: Aram Candela M.D.   On: 03/16/2020 19:41   DG Tibia/Fibula Left  Result Date: 03/16/2020 CLINICAL DATA:  Left lower leg pain after fall 5 days ago. EXAM: LEFT TIBIA AND FIBULA - 2 VIEW COMPARISON:  None. FINDINGS: There is no evidence of fracture or other focal bone lesions.  Patchy sclerosis in the distal femur and proximal tibia consistent with chronic bone infarcts. Soft tissues are unremarkable. IMPRESSION: 1.  No acute osseous abnormality. Electronically Signed   By: Obie Dredge M.D.   On: 03/16/2020 21:14   DG Ankle Complete Left  Result Date: 03/16/2020 CLINICAL DATA:  Left ankle pain after fall 5 days ago. EXAM: LEFT ANKLE COMPLETE - 3+ VIEW COMPARISON:  None. FINDINGS: There is no evidence of fracture, dislocation, or joint effusion. There is no evidence of arthropathy or other focal bone abnormality. Soft tissues are unremarkable. IMPRESSION: Negative. Electronically Signed   By: Obie Dredge M.D.   On: 03/16/2020 21:13   CT Head Wo Contrast  Result Date: 03/16/2020 CLINICAL DATA:  Headache after fall 5 days ago. EXAM: CT HEAD WITHOUT CONTRAST TECHNIQUE: Contiguous axial images were obtained from the base of the skull through the vertex without intravenous contrast. COMPARISON:  None. FINDINGS: Brain: No evidence of acute infarction, hemorrhage, hydrocephalus, extra-axial collection or mass lesion/mass effect. Age advanced mild cerebral atrophy. Vascular: No hyperdense vessel or unexpected calcification. Skull: Normal. Negative for fracture or focal lesion. Sinuses/Orbits: No acute finding. Other: Small left parietal scalp hematoma. IMPRESSION: 1. No acute intracranial abnormality. Small left parietal scalp hematoma. 2. Age advanced mild cerebral atrophy. Electronically Signed   By: Obie Dredge M.D.   On: 03/16/2020 21:07   DG Knee Complete 4 Views Right  Result Date: 03/16/2020 CLINICAL DATA:  Status post fall 5 days ago. EXAM: RIGHT KNEE - COMPLETE 4+ VIEW COMPARISON:  None. FINDINGS: No evidence of an acute fracture or dislocation. No evidence of arthropathy. Benign appearing curvilinear sclerotic areas are seen within the medullary portion of the distal right femoral shaft. A large joint effusion is noted. IMPRESSION: 1. Large joint effusion without  evidence of acute osseous abnormality. Electronically Signed   By: Aram Candela M.D.   On: 03/16/2020 19:43   DG Hip Unilat W or Wo Pelvis 2-3 Views Right  Result Date: 03/16/2020 CLINICAL DATA:  Right hip pain.  Recent fall. EXAM: DG HIP (WITH OR WITHOUT PELVIS) 2-3V RIGHT COMPARISON:  None. FINDINGS: No acute fracture or dislocation. Prior right total hip arthroplasty. No evidence of hardware failure or loosening. Subchondral sclerosis in the left femoral head. Soft tissues are unremarkable. IMPRESSION: 1. No acute  osseous abnormality. 2. Prior right total hip arthroplasty without evidence of hardware complication. 3. Left femoral head avascular necrosis. Electronically Signed   By: Obie DredgeWilliam T Derry M.D.   On: 03/16/2020 21:09    EKG: Not reviewed.   Assessment/Plan Active Problems:   Supratherapeutic INR   ETOH abuse   Fall at home, initial encounter   Renal transplant recipient   H/O mitral valve replacement   Medical non-compliance   #Fall mechanical likely from Etoh intoxication #Left femoral avascular necrosis form above   #Supra therapeutic INR >10 due to failure to f/u with [facility] pharmacy -No evidence of active bleeding.  -Goal INR 2.5-3.5 for mechanical mitral valve.  -Received 1 dose of vitamin K in ED --Resume warfarin when therapeutic range.  #UTI -CTX  #EtOH abuse with abnormal LFTs AST>ALT    #Status post renal transplant -Continue with CellCept and Prograf.   #Depression -Hold Lexapro, Wellbutrin and antihistamines due to somnolence.   #Monitor for Etoh withdrawal.     DVT prophylaxis: INR>10  Code Status: Full code Family Communication:None  Disposition Plan: Home   Consults called:Pharmacy   Admission status: inpatient    Mohammadtokir Mujtaba MD [facility]   If 7PM-7AM, please contact night-coverage [email or web address] Password [email or web address]  03/17/2020, 1:20 AM

## 2020-03-17 NOTE — Progress Notes (Signed)
ANTICOAGULATION CONSULT NOTE - Initial Consult  Pharmacy Consult for warfarin Indication: mechanical valve  Allergies  Allergen Reactions  . Ibuprofen     Bad For Kidneys    Patient Measurements: Height: 5\' 2"  (157.5 cm) Weight: 49.9 kg (110 lb) IBW/kg (Calculated) : 50.1  Vital Signs: Temp: 98.3 F (36.8 C) (02/23 1010) Temp Source: Oral (02/23 1010) BP: 111/94 (02/23 1010) Pulse Rate: 121 (02/23 1010)  Labs: Recent Labs    03/16/20 2240 03/17/20 0130 03/17/20 0136 03/17/20 0512  HGB 8.8*  --   --  8.1*  HCT 26.2*  --   --  24.6*  PLT 152  --   --  99*  LABPROT 89.5* 84.5*  --  85.6*  INR >10.0* >10.0*  --  >10.0*  CREATININE 0.90  --  0.77 0.61  CKTOTAL 283*  --   --   --     Estimated Creatinine Clearance: 74.4 mL/min (by C-G formula based on SCr of 0.61 mg/dL).   Medical History: Past Medical History:  Diagnosis Date  . Alcohol abuse     Medications: Warfarin PTA  Assessment: Pt is a 66 yoF with PMH significant for renal transplant, EtOH abuse, and mechanical mitral valve replacement. Pt is prescribed warfarin PTA For mechanical mitral valve. Pt admitted s/p fall, found to have supratherapeutic INR. CT head: No acute intracranial abnormality, small left parietal scalp hematoma.   War[facility]in previously managed [facility] in [place], [place]. Spoke with clinic - reports patient was discharged from clinic due to non-compliance/no showing appointments. Reported most recently prescribed dose on record was: warfarin 10 mg MWF, 20 mg TTSS.   INR on admission >10 Last dose: 03/15/20  Significant Events:  -2/23 Vitamin K 5 mg subQ x1 given at 0001  Today, 03/17/20  INR >10 remains SUPRAtherapeutic  Discussed with MD. Pt does have bruising s/p fall, but no signs of active bleeding  CBC: Hgb (8.1), Plt (99). Both low  Diet: Heart healthy; 15% of meal charted  DDI: None significant. Pt on antibiotics which have potential to enhance effects of  warfarin  Goal of Therapy:  INR 2.5 - 3.5 Monitor platelets by anticoagulation protocol: Yes   Plan: Discussed with MD  Will recheck INR at 1800 this evening then daily with AM labs  Once INR <2.5, will initiate therapeutic LMWH for [facility] bridge given history of mechanical valve  [facility], PharmD 03/17/2020,2:04 PM

## 2020-03-18 ENCOUNTER — Inpatient Hospital Stay (HOSPITAL_COMMUNITY): Payer: Medicare Other

## 2020-03-18 DIAGNOSIS — Z952 Presence of prosthetic heart valve: Secondary | ICD-10-CM

## 2020-03-18 DIAGNOSIS — W19XXXA Unspecified fall, initial encounter: Secondary | ICD-10-CM

## 2020-03-18 DIAGNOSIS — Y92009 Unspecified place in unspecified non-institutional (private) residence as the place of occurrence of the external cause: Secondary | ICD-10-CM

## 2020-03-18 DIAGNOSIS — Z9119 Patient's noncompliance with other medical treatment and regimen: Secondary | ICD-10-CM

## 2020-03-18 DIAGNOSIS — F1010 Alcohol abuse, uncomplicated: Secondary | ICD-10-CM

## 2020-03-18 DIAGNOSIS — Z940 Kidney transplant status: Secondary | ICD-10-CM

## 2020-03-18 LAB — CBC WITH DIFFERENTIAL/PLATELET
Abs Immature Granulocytes: 0.02 10*3/uL (ref 0.00–0.07)
Basophils Absolute: 0 10*3/uL (ref 0.0–0.1)
Basophils Relative: 0 %
Eosinophils Absolute: 0.1 10*3/uL (ref 0.0–0.5)
Eosinophils Relative: 3 %
HCT: 25.7 % — ABNORMAL LOW (ref 36.0–46.0)
Hemoglobin: 8.4 g/dL — ABNORMAL LOW (ref 12.0–15.0)
Immature Granulocytes: 0 %
Lymphocytes Relative: 27 %
Lymphs Abs: 1.2 10*3/uL (ref 0.7–4.0)
MCH: 31.8 pg (ref 26.0–34.0)
MCHC: 32.7 g/dL (ref 30.0–36.0)
MCV: 97.3 fL (ref 80.0–100.0)
Monocytes Absolute: 0.3 10*3/uL (ref 0.1–1.0)
Monocytes Relative: 7 %
Neutro Abs: 2.8 10*3/uL (ref 1.7–7.7)
Neutrophils Relative %: 63 %
Platelets: 96 10*3/uL — ABNORMAL LOW (ref 150–400)
RBC: 2.64 MIL/uL — ABNORMAL LOW (ref 3.87–5.11)
RDW: 21.2 % — ABNORMAL HIGH (ref 11.5–15.5)
WBC: 4.5 10*3/uL (ref 4.0–10.5)
nRBC: 0.4 % — ABNORMAL HIGH (ref 0.0–0.2)

## 2020-03-18 LAB — PROTIME-INR
INR: 1.8 — ABNORMAL HIGH (ref 0.8–1.2)
Prothrombin Time: 20.1 seconds — ABNORMAL HIGH (ref 11.4–15.2)

## 2020-03-18 LAB — COMPREHENSIVE METABOLIC PANEL
ALT: 60 U/L — ABNORMAL HIGH (ref 0–44)
AST: 116 U/L — ABNORMAL HIGH (ref 15–41)
Albumin: 3.6 g/dL (ref 3.5–5.0)
Alkaline Phosphatase: 54 U/L (ref 38–126)
Anion gap: 11 (ref 5–15)
BUN: 17 mg/dL (ref 6–20)
CO2: 31 mmol/L (ref 22–32)
Calcium: 9.8 mg/dL (ref 8.9–10.3)
Chloride: 91 mmol/L — ABNORMAL LOW (ref 98–111)
Creatinine, Ser: 0.81 mg/dL (ref 0.44–1.00)
GFR, Estimated: >60 mL/min (ref >60)
Glucose, Bld: 97 mg/dL (ref 70–99)
Potassium: 3.4 mmol/L — ABNORMAL LOW (ref 3.5–5.1)
Sodium: 133 mmol/L — ABNORMAL LOW (ref 135–145)
Total Bilirubin: 1.2 mg/dL (ref 0.3–1.2)
Total Protein: 6.6 g/dL (ref 6.5–8.1)

## 2020-03-18 MED ORDER — WARFARIN SODIUM 5 MG PO TABS
15.0000 mg | ORAL_TABLET | Freq: Once | ORAL | Status: AC
  Administered 2020-03-18: 15 mg via ORAL
  Filled 2020-03-18: qty 3

## 2020-03-18 MED ORDER — MIRTAZAPINE 15 MG PO TABS
30.0000 mg | ORAL_TABLET | Freq: Every day | ORAL | Status: DC
  Administered 2020-03-18: 30 mg via ORAL
  Filled 2020-03-18: qty 2

## 2020-03-18 MED ORDER — ENOXAPARIN SODIUM 60 MG/0.6ML ~~LOC~~ SOLN
1.0000 mg/kg | Freq: Two times a day (BID) | SUBCUTANEOUS | Status: DC
  Administered 2020-03-18 – 2020-03-19 (×3): 50 mg via SUBCUTANEOUS
  Filled 2020-03-18 (×4): qty 0.6

## 2020-03-18 MED ORDER — BUPROPION HCL 75 MG PO TABS
75.0000 mg | ORAL_TABLET | Freq: Every day | ORAL | Status: DC
  Administered 2020-03-18 – 2020-03-19 (×2): 75 mg via ORAL
  Filled 2020-03-18 (×2): qty 1

## 2020-03-18 MED ORDER — GABAPENTIN 300 MG PO CAPS
600.0000 mg | ORAL_CAPSULE | Freq: Two times a day (BID) | ORAL | Status: DC
Start: 2020-03-18 — End: 2020-03-19
  Administered 2020-03-18 – 2020-03-19 (×3): 600 mg via ORAL
  Filled 2020-03-18 (×3): qty 2

## 2020-03-18 MED ORDER — POTASSIUM CHLORIDE CRYS ER 20 MEQ PO TBCR
40.0000 meq | EXTENDED_RELEASE_TABLET | Freq: Once | ORAL | Status: AC
  Administered 2020-03-18: 40 meq via ORAL
  Filled 2020-03-18: qty 2

## 2020-03-18 MED ORDER — ENSURE ENLIVE PO LIQD
237.0000 mL | ORAL | Status: DC
  Administered 2020-03-19: 237 mL via ORAL

## 2020-03-18 MED ORDER — PROSOURCE PLUS PO LIQD
30.0000 mL | Freq: Two times a day (BID) | ORAL | Status: DC
  Administered 2020-03-18 – 2020-03-19 (×2): 30 mL via ORAL
  Filled 2020-03-18 (×2): qty 30

## 2020-03-18 MED ORDER — WARFARIN - PHARMACIST DOSING INPATIENT: Freq: Every day | Status: DC

## 2020-03-18 NOTE — Progress Notes (Signed)
Initial Nutrition Assessment  DOCUMENTATION CODES:   Not applicable  INTERVENTION:  - will order 30 ml Prosource Plus BID, each supplement provides 100 kcal and 15 grams protein. - will decrease Ensure Enlive from BID to once/day, each supplement provides 350 kcal and 20 grams of protein.  NUTRITION DIAGNOSIS:   Increased nutrient needs related to acute illness as evidenced by estimated needs.  GOAL:   Patient will meet greater than or equal to 90% of their needs  MONITOR:   PO intake,Supplement acceptance,Labs,Weight trends  REASON FOR ASSESSMENT:   Malnutrition Screening Tool  ASSESSMENT:   40 y.o. female with medical history of alcohol abuse, ESRD s/p live donor renal transplant 04/08/2013, s/p mechanical MVR, and drinks 1/5th vodka per day. Patient presented to the ED via EMS due to inability to walk.  She consumed 100% of dinner last night (637 kcal and 21 grams protein) and 100% of breakfast today (392 kcal and 25 grams protein).  Ensure Enlive ordered BID per ONS protocol and she has accepted Ensure 2 of the 3 times offered.   Weight on 2/22 was 110 lb, which appears to be a stated weight. Weight on 01/15/20 was 108 lb at [facility], weight on 12/29/19 was 107 lb at [facility], and weight on 11/08/19 was 100 lb at [facility].  Unable to see patient at this time.   Per notes: - s/p fall d/t possible alcohol intoxication  - L femoral head necrosis, large R knee joint effusion - possible UTI   Labs reviewed; Na: 133 mmol/l, K: 3.4 mmol/l, Cl: 91 mmol/l, LFTs elevated but trending down.  Medications reviewed; 1 mg folvite/day, 1 tablet multivitamin with minerals/day, 100 mg thiamine/day.     NUTRITION - FOCUSED PHYSICAL EXAM:  unable to complete at this time.   Diet Order:   Diet Order            Diet Heart Room service appropriate? Yes; Fluid consistency: Thin  Diet effective now                 EDUCATION NEEDS:   No education needs  have been identified at this time  Skin:  Skin Assessment: Reviewed RN Assessment  Last BM:  2/22  Height:   Ht Readings from Last 1 Encounters:  03/16/20 5\' 2"  (1.575 m)    Weight:   Wt Readings from Last 1 Encounters:  03/16/20 49.9 kg    Estimated Nutritional Needs:  Kcal:  1750-2000 kcal Protein:  85-100 grams Fluid:  >/= 2 L/day      03/18/20, MS, RD, LDN, CNSC Inpatient Clinical Dietitian RD pager # available in AMION  After hours/weekend pager # available in [facility]

## 2020-03-18 NOTE — Progress Notes (Addendum)
Patient refusing to keep telemetry on. Provided patient education on why telemetry is needed and patient still refusing. I will continue to monitor and provide further education on the need for telemetry. Telemetry currently on standby.  Caprice Red, RN   [phone] re-assessed patient's willingness to put the telemetry monitoring back on, patient continued to refuse, despite education on why telemetry monitoring is ordered.

## 2020-03-18 NOTE — Progress Notes (Signed)
ANTICOAGULATION CONSULT NOTE  Pharmacy Consult for warfarin Indication: mechanical valve  Allergies  Allergen Reactions  . Ibuprofen     Bad For Kidneys    Patient Measurements: Height: 5\' 2"  (157.5 cm) Weight: 49.9 kg (110 lb) IBW/kg (Calculated) : 50.1  Vital Signs: Temp: 98.4 F (36.9 C) (02/24 0600) Temp Source: Oral (02/24 0600) BP: 99/77 (02/24 0600) Pulse Rate: 92 (02/24 0600)  Labs: Recent Labs    03/16/20 2240 03/17/20 0130 03/17/20 0136 03/17/20 0512 03/17/20 1811 03/18/20 0501  HGB 8.8*  --   --  8.1*  --  8.4*  HCT 26.2*  --   --  24.6*  --  25.7*  PLT 152  --   --  99*  --  96*  LABPROT 89.5*   < >  --  85.6* 41.4* 20.1*  INR >10.0*   < >  --  >10.0* 4.5* 1.8*  CREATININE 0.90  --  0.77 0.61  --  0.81  CKTOTAL 283*  --   --   --   --   --    < > = values in this interval not displayed.    Estimated Creatinine Clearance: 73.5 mL/min (by C-G formula based on SCr of 0.81 mg/dL).   Medical History: Past Medical History:  Diagnosis Date  . Alcohol abuse     Medications: Warfarin PTA  Assessment: Pt is a 51 yoF with PMH significant for renal transplant, EtOH abuse, and mechanical mitral valve replacement. Pt is prescribed warfarin PTA For mechanical mitral valve. Pt admitted s/p fall, found to have supratherapeutic INR. CT head: No acute intracranial abnormality, small left parietal scalp hematoma.   Warfarin previously managed [facility] in [place], [place]. Spoke with clinic - reports patient was discharged from clinic due to non-compliance/no showing appointments. Reported most recently prescribed dose on record was: warfarin 10 mg MWF, 20 mg TTSS.   INR on admission >10 Last dose: 03/15/20  Significant Events:  -2/23 Vitamin K 5 mg subQ x1 given at 0001  Today, 03/18/20  INR now low after vitamin K yesterday; bridging with Lovenox per MD  Pt does have bruising s/p fall, but no signs of active bleeding  CBC: Hgb (8.4), Plt  (96). Both low  Diet: Heart healthy; 15% of meal charted  DDI: None significant. Pt on antibiotics which have potential to enhance effects of warfarin  Goal of Therapy:  INR 2.5 - 3.5 Monitor platelets by anticoagulation protocol: Yes   Plan:   Warfarin 15 mg PO today - given elevated INR on admission and acute illness, avoiding boosted doses until warfarin responsiveness more apparent  Continue Lovenox 1 mg/kg SQ q12 hr while INR low  INR daily, CBC at least q48 given low Plt  Monitor for signs of bleeding or thrombosis  Adelisa Satterwhite A, PharmD 03/18/2020,8:54 AM

## 2020-03-18 NOTE — Progress Notes (Signed)
Delay in patient care. Order clarification needed for MRI. Messaged MD via secure chat, waiting on response.

## 2020-03-18 NOTE — Consult Note (Signed)
Patient ID: Leah Roth MRN: [id number] DOB/AGE: June 28, 1980 40 y.o.  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 She has had previous orthopedic care through [facility] including a right THA. She had a mechanical fall down her apartment stairs 5 days ago and called EMS today when she was unable to ambulate. Orthopedic consult was requested to evaluate her right knee.   Past Medical History: Past Medical History:  Diagnosis Date  . Alcohol abuse     Family History: History reviewed. No pertinent family history.  Social History: Social History   Socioeconomic History  . Marital status: Divorced    Spouse name: Not on file  . Number of children: Not on file  . Years of education: Not on file  . Highest education level: Not on file  Occupational History  . Not on file  Tobacco Use  . Smoking status: Current Every Day Smoker    Packs/day: 0.50    Types: Cigarettes  . Smokeless tobacco: Never Used  Vaping Use  . Vaping Use: Every day  . Substances: Nicotine  Substance and Sexual Activity  . Alcohol use: Yes    Comment: drinks a fifth a day  . Drug use: Never  . Sexual activity: Not on file  Other Topics Concern  . Not on file  Social History Narrative  . Not on file   Social Determinants of Health   Financial Resource Strain: Not on file  Food Insecurity: Not on file  Transportation Needs: Not on file  Physical Activity: Not on file  Stress: Not on file  Social Connections: Not on file  Intimate Partner Violence: Not on file    Allergies: Ibuprofen  Medications: I have reviewed  the patient's current medications.  Vital Signs: Patient Vitals for the past 24 hrs:  BP Temp Temp src Pulse Resp SpO2  03/18/20 1401 113/82 98.6 F (37 C) Oral (!) 110 16 98 %  03/18/20 1034 99/67 98 F (36.7 C) Axillary 100 16 97 %  03/18/20 0600 99/77 98.4 F (36.9 C) Oral 92 - 98 %  03/18/20 0227 106/75 98.2 F (36.8 C) Oral (!) 108 20 98 %  03/17/20 2348 108/89 - - (!) 108 - -  03/17/20 2317 108/89 - - (!) 112 - -  03/17/20 2203 116/79 99 F (37.2 C) Oral (!) 113 17 96 %  03/17/20 2014 116/80 98.5 F (36.9 C) Oral (!) 106 - 100 %  03/17/20 1812 115/87 98.8 F (37.1 C) - (!) 114 16 94 %    Radiology: DG Chest 2 View  Result Date: 03/16/2020 CLINICAL DATA:  Anterior chest pain after fall 5 days ago. EXAM: CHEST - 2 VIEW COMPARISON:  None. FINDINGS: The heart size and mediastinal contours are within normal limits. Prior mitral valve replacement. Normal pulmonary vascularity. No focal consolidation, pleural effusion, or pneumothorax. No acute osseous abnormality. Old bilateral clavicle fracture status post ORIF. IMPRESSION: No active cardiopulmonary disease. Electronically Signed   By: Obie Dredge M.D.   On: 03/16/2020 21:11   DG Lumbar Spine Complete  Result Date: 03/16/2020 CLINICAL DATA:  Back pain after fall 5 days ago. EXAM:  LUMBAR SPINE - COMPLETE 4+ VIEW COMPARISON:  None. FINDINGS: Five lumbar type vertebral bodies. No acute fracture or subluxation. Vertebral body heights are preserved. Alignment is normal. Intervertebral disc spaces are maintained. The sacroiliac joints are unremarkable. IMPRESSION: Negative. Electronically Signed   By: Obie Dredge M.D.   On: 03/16/2020 21:12   DG Wrist Complete Right  Result Date: 03/16/2020 CLINICAL DATA:  Status post fall 5 days ago. EXAM: RIGHT WRIST - COMPLETE 3+ VIEW COMPARISON:  None. FINDINGS: There is no evidence of fracture or dislocation. There is no evidence of arthropathy or other focal bone abnormality. Soft tissues  are unremarkable. IMPRESSION: Negative. Electronically Signed   By: Aram Candela M.D.   On: 03/16/2020 19:41   DG Tibia/Fibula Left  Result Date: 03/16/2020 CLINICAL DATA:  Left lower leg pain after fall 5 days ago. EXAM: LEFT TIBIA AND FIBULA - 2 VIEW COMPARISON:  None. FINDINGS: There is no evidence of fracture or other focal bone lesions. Patchy sclerosis in the distal femur and proximal tibia consistent with chronic bone infarcts. Soft tissues are unremarkable. IMPRESSION: 1.  No acute osseous abnormality. Electronically Signed   By: Obie Dredge M.D.   On: 03/16/2020 21:14   DG Ankle Complete Left  Result Date: 03/16/2020 CLINICAL DATA:  Left ankle pain after fall 5 days ago. EXAM: LEFT ANKLE COMPLETE - 3+ VIEW COMPARISON:  None. FINDINGS: There is no evidence of fracture, dislocation, or joint effusion. There is no evidence of arthropathy or other focal bone abnormality. Soft tissues are unremarkable. IMPRESSION: Negative. Electronically Signed   By: Obie Dredge M.D.   On: 03/16/2020 21:13   CT Head Wo Contrast  Result Date: 03/16/2020 CLINICAL DATA:  Headache after fall 5 days ago. EXAM: CT HEAD WITHOUT CONTRAST TECHNIQUE: Contiguous axial images were obtained from the base of the skull through the vertex without intravenous contrast. COMPARISON:  None. FINDINGS: Brain: No evidence of acute infarction, hemorrhage, hydrocephalus, extra-axial collection or mass lesion/mass effect. Age advanced mild cerebral atrophy. Vascular: No hyperdense vessel or unexpected calcification. Skull: Normal. Negative for fracture or focal lesion. Sinuses/Orbits: No acute finding. Other: Small left parietal scalp hematoma. IMPRESSION: 1. No acute intracranial abnormality. Small left parietal scalp hematoma. 2. Age advanced mild cerebral atrophy. Electronically Signed   By: Obie Dredge M.D.   On: 03/16/2020 21:07   DG Knee Complete 4 Views Right  Result Date: 03/16/2020 CLINICAL DATA:  Status post  fall 5 days ago. EXAM: RIGHT KNEE - COMPLETE 4+ VIEW COMPARISON:  None. FINDINGS: No evidence of an acute fracture or dislocation. No evidence of arthropathy. Benign appearing curvilinear sclerotic areas are seen within the medullary portion of the distal right femoral shaft. A large joint effusion is noted. IMPRESSION: 1. Large joint effusion without evidence of acute osseous abnormality. Electronically Signed   By: Aram Candela M.D.   On: 03/16/2020 19:43   DG Hip Unilat W or Wo Pelvis 2-3 Views Right  Result Date: 03/16/2020 CLINICAL DATA:  Right hip pain.  Recent fall. EXAM: DG HIP (WITH OR WITHOUT PELVIS) 2-3V RIGHT COMPARISON:  None. FINDINGS: No acute fracture or dislocation. Prior right total hip arthroplasty. No evidence of hardware failure or loosening. Subchondral sclerosis in the left femoral head. Soft tissues are unremarkable. IMPRESSION: 1. No acute osseous abnormality. 2. Prior right total hip arthroplasty without evidence of hardware complication. 3. Left femoral head avascular necrosis. Electronically Signed   By: Obie Dredge M.D.   On: 03/16/2020 21:09  Labs: Recent Labs    03/17/20 0512 03/18/20 0501  WBC 5.4 4.5  RBC 2.59* 2.64*  HCT 24.6* 25.7*  PLT 99* 96*   Recent Labs    03/17/20 0512 03/18/20 0501  NA 137 133*  K 3.6 3.4*  CL 97* 91*  CO2 26 31  BUN 18 17  CREATININE 0.61 0.81  GLUCOSE 103* 97  CALCIUM 8.5* 9.8   Recent Labs    03/17/20 1811 03/18/20 0501  INR 4.5* 1.8*    Review of Systems: Review of Systems  Constitutional: Negative.   Musculoskeletal: Positive for falls, joint pain and myalgias.  Skin: Negative.     Physical Exam: Body mass index is 20.12 kg/m.  General: AAOx3, NAD Ambulation: Normal using rolling walker, patient appears to be weight bearing on b/l lower extremities. Inspection: positive edema and ecchymosis over right knee. Positive for healing scab. Palpation: Generalized TTP over right knee, no significant  joint line tenderness.  AROM: Right knee flexion and extension intact, although painful, minimally decreased Strength: 5/5 LE motor strength Sensation: Distal sensation intact bilaterally PV: Distal pulses intact, cap refill <2 sec   Special tests: somewhat limited due to swelling and muscle guarding,  But no significant laxity that I can note with Varus, valgus, and Lachman's testing.   X-ray: No acute bony injury seen. Positive for effusion.  Assessment and Plan:  Right knee effusion s/p fall, no systemic features or concern for septic joint, no acute fracture.   - Recommend Ace wrap compression and Ice to decrease inflammation. - Recommend knee immobilizer to decrease ROM and improve comfort. - WBAT using rolling walker  - Recommend MRI of right knee for evaluation of soft tissue injury.   Will likely recommend patient f/u as an outpatient with orthopedics for further care if needed.      [facility] PA-C [facility]

## 2020-03-18 NOTE — Progress Notes (Addendum)
PROGRESS NOTE  Leah Roth ZOX:[id number] DOB: 10-23-1980 DOA: 03/16/2020 PCP: System, Provider Not In  HPI/Recap of past 24 hours: HPI from Dr Geraldo PitterMujtaba Tatiyana Roth is a 40 y.o. 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 Pt extremely somnolent likely from ED meds but reports a mechanical fall down her apt stairs with head and face trauma 5 days ago. No LOC. Pt called EMS today as couldn't walk. Pt denies any fever or chills. Pt lives with husband. Doesn't have any children. Active smoker 2 ppd X 20 years. Drinks a fifth of vodka daily. Prior cocaine use. In the ED, 139/127, pulse 122, RR 23, SPO2 96% room air. 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 Right knee joint effusion without evidence of acute osseous abnormality. Right total hip arthroplasty without evidence of hardware complication. Head CT with small left parietal scalp hematoma.  Age advanced mild cerebral atrophy.  Patient admitted for further management    Today, patient denies any new complaints, still reports tenderness around the right knee.   Assessment/Plan: Active Problems:   Supratherapeutic INR   ETOH abuse   Fall at home, initial encounter   Renal transplant recipient   H/O mitral valve replacement   Medical non-compliance   Mechanical fall from possible alcohol intoxication Large right knee joint effusion Left femoral head avascular necrosis Trauma imaging showed left femoral head avascular necrosis, large right knee joint effusion without evidence of acute osseous abnormality. Right total hip arthroplasty without evidence of hardware complication Head CT with small left parietal scalp hematoma.  Age advanced mild cerebral  atrophy. Ortho consulted, recommendation for right knee MRI, knee immobilizer [facility] for left femoral head avascular necrosis management, had right total hip arthro[facility] due to [facility] Fall precautions, PT  Supratherapeutic INR Resolved INR >10 on admission Noncompliant to follow-up S/p vitamin K [place] Restart warfarin, bridge with Lovenox until therapeutic range, management by pharmacy  E. coli UTI Small leukocytes, many bacteria, WBC 21-50, large hemoglobin Urine culture grew >100,000 E. coli Continue IV ceftriaxone  Alcohol abuse with possible withdrawal Transaminitis AST> ALT Drinks 1/5 of vodka daily EtOH level elevated on admission Reports Ativan causes hallucination, start Librium detox protocol Monitor closely Advised to quit alcohol  Normocytic anemia/?acute blood loss anemia Hemoglobin WNL on 12/2019, no recent labs since then On admission has been around 8 and remained stable Anemia panel in a.m. Daily CBC  Status post renal transplant Continue with CellCept and Prograf  Depression Continue Lexapro, Wellbutrin for now  Tobacco abuse Advised to quit Nicotine patch     Malnutrition Type:  Nutrition Problem: Increased nutrient needs Etiology: acute illness   Malnutrition Characteristics:  Signs/Symptoms: estimated needs   Nutrition Interventions:  Interventions: Prostat    Estimated body mass index is 20.12 kg/m as calculated from the following:   Height as of this encounter: 5\' 2"  (1.575 m).   Weight as of this encounter: 49.9 kg.     Code Status: Full  Family Communication: None at bedside  Disposition Plan: Status is: Inpatient  Remains inpatient appropriate because:Inpatient level of care appropriate due to severity of illness   Dispo: The patient is from: Home  Anticipated d/c is to: Home              Anticipated d/c date is: 2 days               Patient currently is not medically stable to d/c.   Difficult to place patient No    Consultants:  Orthopedics  Procedures:  None  Antimicrobials:  Ceftriaxone  DVT prophylaxis: Warfarin   Objective: Vitals:   03/18/20 0227 03/18/20 0600 03/18/20 1034 03/18/20 1401  BP: 106/75 99/77 99/67  113/82  Pulse: (!) 108 92 100 (!) 110  Resp: 20  16 16   Temp: 98.2 F (36.8 C) 98.4 F (36.9 C) 98 F (36.7 C) 98.6 F (37 C)  TempSrc: Oral Oral Axillary Oral  SpO2: 98% 98% 97% 98%  Weight:      Height:        Intake/Output Summary (Last 24 hours) at 03/18/2020 1809 Last data filed at 03/18/2020 1000 Gross per 24 hour  Intake 1040.11 ml  Output -  Net 1040.11 ml   Filed Weights   03/16/20 1810  Weight: 49.9 kg    Exam:  General: NAD, left-sided scalp hematoma  Cardiovascular: S1, S2 present  Respiratory: CTAB  Abdomen: Soft, nontender, nondistended, bowel sounds present  Musculoskeletal: No bilateral pedal edema noted, right knee swelling with pain and some hematoma noted  Skin:  Multiple bruises noted all over extremities, back  Psychiatry: Normal mood   Data Reviewed: CBC: Recent Labs  Lab 03/16/20 2240 03/17/20 0512 03/18/20 0501  WBC 6.9 5.4 4.5  NEUTROABS 4.4 3.9 2.8  HGB 8.8* 8.1* 8.4*  HCT 26.2* 24.6* 25.7*  MCV 94.2 95.0 97.3  PLT 152 99* 96*   Basic Metabolic Panel: Recent Labs  Lab 03/16/20 2240 03/17/20 0136 03/17/20 0512 03/18/20 0501  NA 138 137 137 133*  K 4.9 4.1 3.6 3.4*  CL 97* 97* 97* 91*  CO2 26 27 26 31   GLUCOSE 96 141* 103* 97  BUN 17 19 18 17   CREATININE 0.90 0.77 0.61 0.81  CALCIUM 8.2* 8.2* 8.5* 9.8   GFR: Estimated Creatinine Clearance: 73.5 mL/min (by C-G formula based on SCr of 0.81 mg/dL). Liver Function Tests: Recent Labs  Lab 03/16/20 2240 03/17/20 0136 03/17/20 0512 03/18/20 0501  AST 217* 188* 156* 116*  ALT 81* 71* 70* 60*  ALKPHOS 55 49 50 54  BILITOT 1.2 0.7 1.0 1.2  PROT 6.9 6.6 6.7 6.6   ALBUMIN 3.7 3.7 3.7 3.6   No results for input(s): LIPASE, AMYLASE in the last 168 hours. No results for input(s): AMMONIA in the last 168 hours. Coagulation Profile: Recent Labs  Lab 03/16/20 2240 03/17/20 0130 03/17/20 0512 03/17/20 1811 03/18/20 0501  INR >10.0* >10.0* >10.0* 4.5* 1.8*   Cardiac Enzymes: Recent Labs  Lab 03/16/20 2240  CKTOTAL 283*   BNP (last 3 results) No results for input(s): PROBNP in the last 8760 hours. HbA1C: No results for input(s): HGBA1C in the last 72 hours. CBG: No results for input(s): GLUCAP in the last 168 hours. Lipid Profile: No results for input(s): CHOL, HDL, LDLCALC, TRIG, CHOLHDL, LDLDIRECT in the last 72 hours. Thyroid Function Tests: No results for input(s): TSH, T4TOTAL, FREET4, T3FREE, THYROIDAB in the last 72 hours. Anemia Panel: No results for input(s): VITAMINB12, FOLATE, FERRITIN, TIBC, IRON, RETICCTPCT in the last 72 hours. Urine analysis:    Component Value Date/Time   COLORURINE [place] (A) 03/16/2020 2330   APPEARANCEUR HAZY (A) 03/16/2020 2330   LABSPEC 1.018  03/16/2020 2330   PHURINE 5.0 03/16/2020 2330   GLUCOSEU NEGATIVE 03/16/2020 2330   HGBUR LARGE (A) 03/16/2020 2330   BILIRUBINUR NEGATIVE 03/16/2020 2330   KETONESUR 5 (A) 03/16/2020 2330   PROTEINUR 100 (A) 03/16/2020 2330   NITRITE NEGATIVE 03/16/2020 2330   LEUKOCYTESUR SMALL (A) 03/16/2020 2330   Sepsis Labs: @LABRCNTIP (procalcitonin:4,lacticidven:4)  ) Recent Results (from the past 240 hour(s))  SARS CORONAVIRUS 2 (TAT 6-24 HRS) Nasopharyngeal Nasopharyngeal Swab     Status: None   Collection Time: 03/16/20 11:31 PM   Specimen: Nasopharyngeal Swab  Result Value Ref Range Status   SARS Coronavirus 2 NEGATIVE NEGATIVE Final    Comment: (NOTE) SARS-CoV-2 target nucleic acids are NOT DETECTED.  The SARS-CoV-2 RNA is generally detectable in upper and lower respiratory specimens during the acute phase of infection. Negative results do not preclude  SARS-CoV-2 infection, do not rule out co-infections with other pathogens, and should not be used as the sole basis for treatment or other patient management decisions. Negative results must be combined with clinical observations, patient history, and epidemiological information. The expected result is Negative.  Fact Sheet for Patients: 03/18/20  Fact Sheet for Healthcare Providers: HairSlick.no  This test is not yet approved or cleared by the quierodirigir.com FDA and  has been authorized for detection and/or diagnosis of SARS-CoV-2 by FDA under an Emergency Use Authorization (EUA). This EUA will remain  in effect (meaning this test can be used) for the duration of the COVID-19 declaration under Se ction 564(b)(1) of the Act, 21 U.S.C. section 360bbb-3(b)(1), unless the authorization is terminated or revoked sooner.  Performed at [facility], [address] [place], [place]   Urine culture     Status: Abnormal (Preliminary result)   Collection Time: 03/16/20 11:35 PM   Specimen: Urine, Random  Result Value Ref Range Status   Specimen Description   Final    URINE, RANDOM Performed at [facility], [address] [place], [place]    Special Requests   Final    NONE Performed at [facility], [address] [place], [place]    Culture (A)  Final    >=100,000 COLONIES/mL ESCHERICHIA COLI SUSCEPTIBILITIES TO FOLLOW Performed at [facility], [address], [place], [place]    Report Status PENDING  Incomplete      Studies: No results found.  Scheduled Meds: . (feeding supplement) PROSource Plus  30 mL Oral BID BM  . atorvastatin  20 mg Oral Daily  . buPROPion  75 mg Oral Daily  . chlordiazePOXIDE  25 mg Oral TID   Followed by  . [START ON 03/19/2020] chlordiazePOXIDE  25 mg Oral BH-qamhs   Followed by  . [START ON  03/20/2020] chlordiazePOXIDE  25 mg Oral Daily  . enoxaparin (LOVENOX) injection  1 mg/kg Subcutaneous BID  . escitalopram  20 mg Oral Daily  . [START ON 03/19/2020] feeding supplement  237 mL Oral Q24H  . folic acid  1 mg Oral Daily  . gabapentin  600 mg Oral BID  . mirtazapine  30 mg Oral QHS  . multivitamin with minerals  1 tablet Oral Daily  . mycophenolate  540 mg Oral BID  . nicotine  21 mg Transdermal Daily  . tacrolimus  3.5 mg Oral BID  . thiamine  100 mg Oral Daily  . Warfarin - Pharmacist Dosing Inpatient   Does not apply q1600    Continuous  Infusions: . sodium chloride 250 mL (03/17/20 2312)  . cefTRIAXone (ROCEPHIN)  IV 1 g (03/17/20 2315)     LOS: 1 day     Briant Cedar, MD [facility]  If 7PM-7AM, please contact night-coverage [email or web address] 03/18/2020, 6:09 PM

## 2020-03-18 NOTE — Progress Notes (Signed)
Patient requested her home medications of Neurontin, Remeron, and Wellbutrin. On call provider B. Kyere notified via page. New order for 1x dose Neurontin 300 mg and 1x dose Remeron 15 mg. This RN explained to patient that Wellbutrin is on hold per note in chart. Patient expressed that she would like to discuss her home medications with the provider and being able to have them scheduled while in the hospital.   Caprice Red, RN

## 2020-03-19 ENCOUNTER — Ambulatory Visit (HOSPITAL_COMMUNITY): Payer: Medicare Other

## 2020-03-19 ENCOUNTER — Encounter (HOSPITAL_COMMUNITY): Payer: Self-pay

## 2020-03-19 DIAGNOSIS — N390 Urinary tract infection, site not specified: Secondary | ICD-10-CM

## 2020-03-19 DIAGNOSIS — R791 Abnormal coagulation profile: Principal | ICD-10-CM

## 2020-03-19 LAB — CBC WITH DIFFERENTIAL/PLATELET
Abs Immature Granulocytes: 0.04 10*3/uL (ref 0.00–0.07)
Basophils Absolute: 0 10*3/uL (ref 0.0–0.1)
Basophils Relative: 0 %
Eosinophils Absolute: 0.1 10*3/uL (ref 0.0–0.5)
Eosinophils Relative: 1 %
HCT: 25.2 % — ABNORMAL LOW (ref 36.0–46.0)
Hemoglobin: 8.2 g/dL — ABNORMAL LOW (ref 12.0–15.0)
Immature Granulocytes: 1 %
Lymphocytes Relative: 22 %
Lymphs Abs: 1 10*3/uL (ref 0.7–4.0)
MCH: 32.7 pg (ref 26.0–34.0)
MCHC: 32.5 g/dL (ref 30.0–36.0)
MCV: 100.4 fL — ABNORMAL HIGH (ref 80.0–100.0)
Monocytes Absolute: 0.4 10*3/uL (ref 0.1–1.0)
Monocytes Relative: 8 %
Neutro Abs: 2.9 10*3/uL (ref 1.7–7.7)
Neutrophils Relative %: 68 %
Platelets: 93 10*3/uL — ABNORMAL LOW (ref 150–400)
RBC: 2.51 MIL/uL — ABNORMAL LOW (ref 3.87–5.11)
RDW: 22.1 % — ABNORMAL HIGH (ref 11.5–15.5)
WBC: 4.3 10*3/uL (ref 4.0–10.5)
nRBC: 0.5 % — ABNORMAL HIGH (ref 0.0–0.2)

## 2020-03-19 LAB — COMPREHENSIVE METABOLIC PANEL
ALT: 57 U/L — ABNORMAL HIGH (ref 0–44)
AST: 99 U/L — ABNORMAL HIGH (ref 15–41)
Albumin: 3.4 g/dL — ABNORMAL LOW (ref 3.5–5.0)
Alkaline Phosphatase: 54 U/L (ref 38–126)
Anion gap: 11 (ref 5–15)
BUN: 17 mg/dL (ref 6–20)
CO2: 30 mmol/L (ref 22–32)
Calcium: 9.7 mg/dL (ref 8.9–10.3)
Chloride: 96 mmol/L — ABNORMAL LOW (ref 98–111)
Creatinine, Ser: 0.86 mg/dL (ref 0.44–1.00)
GFR, Estimated: >60 mL/min (ref >60)
Glucose, Bld: 133 mg/dL — ABNORMAL HIGH (ref 70–99)
Potassium: 4.2 mmol/L (ref 3.5–5.1)
Sodium: 137 mmol/L (ref 135–145)
Total Bilirubin: 1 mg/dL (ref 0.3–1.2)
Total Protein: 6.5 g/dL (ref 6.5–8.1)

## 2020-03-19 LAB — IRON AND TIBC
Iron: 46 ug/dL (ref 28–170)
Saturation Ratios: 22 % (ref 10.4–31.8)
TIBC: 210 ug/dL — ABNORMAL LOW (ref 250–450)
UIBC: 164 ug/dL

## 2020-03-19 LAB — URINE CULTURE: Culture: >=100000 — AB

## 2020-03-19 LAB — PROTIME-INR
INR: 1.3 — ABNORMAL HIGH (ref 0.8–1.2)
Prothrombin Time: 15.3 seconds — ABNORMAL HIGH (ref 11.4–15.2)

## 2020-03-19 LAB — VITAMIN B12: Vitamin B-12: 549 pg/mL (ref 180–914)

## 2020-03-19 LAB — FOLATE: Folate: 19.2 ng/mL (ref >5.9)

## 2020-03-19 LAB — FERRITIN: Ferritin: 250 ng/mL (ref 11–307)

## 2020-03-19 MED ORDER — METHOCARBAMOL 500 MG PO TABS: 500.0000 mg | ORAL_TABLET | Freq: Three times a day (TID) | ORAL | 0 refills | Status: AC | PRN

## 2020-03-19 MED ORDER — GABAPENTIN 300 MG PO CAPS: 600.0000 mg | ORAL_CAPSULE | Freq: Two times a day (BID) | ORAL | Status: AC

## 2020-03-19 MED ORDER — WARFARIN SODIUM 5 MG PO TABS
10.0000 mg | ORAL_TABLET | Freq: Once | ORAL | Status: AC
  Administered 2020-03-19: 10 mg via ORAL
  Filled 2020-03-19: qty 2

## 2020-03-19 MED ORDER — THIAMINE HCL 100 MG PO TABS: 100.0000 mg | ORAL_TABLET | Freq: Every day | ORAL | 0 refills | Status: AC

## 2020-03-19 MED ORDER — ADULT MULTIVITAMIN W/MINERALS CH: 1.0000 | ORAL_TABLET | Freq: Every day | ORAL | 0 refills | Status: AC

## 2020-03-19 MED ORDER — FOLIC ACID 1 MG PO TABS: 1.0000 mg | ORAL_TABLET | Freq: Every day | ORAL | 0 refills | Status: AC

## 2020-03-19 MED ORDER — CHLORDIAZEPOXIDE HCL 25 MG PO CAPS: 25.0000 mg | ORAL_CAPSULE | Freq: Every day | ORAL | 0 refills | Status: AC

## 2020-03-19 MED ORDER — OXYCODONE HCL 5 MG PO TABS: 5.0000 mg | ORAL_TABLET | Freq: Four times a day (QID) | ORAL | 0 refills | Status: AC | PRN

## 2020-03-19 MED ORDER — CEPHALEXIN 500 MG PO CAPS: 1000.0000 mg | ORAL_CAPSULE | Freq: Two times a day (BID) | ORAL | 0 refills | Status: AC

## 2020-03-19 MED ORDER — ENOXAPARIN SODIUM 60 MG/0.6ML ~~LOC~~ SOLN: 1.0000 mg/kg | Freq: Two times a day (BID) | SUBCUTANEOUS | 0 refills | Status: AC

## 2020-03-19 NOTE — Discharge Summary (Signed)
Discharge Summary  Leah Roth TUU:[id number] DOB: 1981/01/22  PCP: System, Provider Not In  Admit date: 03/16/2020 Discharge date: 03/19/2020  Time spent: 40 mins  Recommendations for Outpatient Follow-up:  1. Follow-up with PCP in 1 week-reports she has a PCP within the [facility] system 2. Follow-up with Coumadin clinic as scheduled 3. Follow-up with previous orthopedics group in [facility] as discussed   Discharge Diagnoses:  Active Hospital Problems   Diagnosis Date Noted  . Supratherapeutic INR 03/17/2020  . ETOH abuse 03/17/2020  . Fall at home, initial encounter 03/17/2020  . Renal transplant recipient 03/17/2020  . H/O mitral valve replacement 03/17/2020  . Medical non-compliance 03/17/2020    Resolved Hospital Problems  No resolved problems to display.    Discharge Condition: Fair  Diet recommendation: Heart healthy  Vitals:   03/19/20 0534 03/19/20 1355  BP: 110/75 95/67  Pulse: (!) 112 (!) 106  Resp: 18 16  Temp: 99.7 F (37.6 C) 99.8 F (37.7 C)  SpO2: 100% 98%    History of present illness:  Leah Roth a 40 y.o.femalewithhistory of EtOH abuse, s/p live donor renal transplant 04/08/2013, s/pmechanicalMVRGoal INR 2.5-3.5chronically on coumadinfollowed [facility] as couldn't walk post fall. Pt extremely somnolent likely from ED meds but reports a mechanical fall down her apt stairs with head and face trauma 5 days ago. No LOC. Pt called EMS today as couldn't walk. Pt denies any fever or chills. Pt lives with husband. Doesn't have any children. Active smoker 2 ppd X 20 years. Drinks a fifth of vodka daily. Prior cocaine use. In the ED, 139/127, pulse 122, RR 23, SPO2 96% room air. 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 Right knee joint effusion without evidence of acute osseous abnormality. Right total hip  arthroplasty without evidence of hardware complication. Head CT with small left parietal scalp hematoma. Age advanced mild cerebral atrophy.  Patient admitted for further management.     Today, discussed extensively with patient about the need to be compliant with medications and appointments.  Discussed about quitting alcohol and tobacco use.  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  Advised patient to follow-up with Coumadin clinic as scheduled here in [place], until she can establish care with the Coumadin clinic in [facility] (planning to move to [facility]).  Patient stated she will also establish PCP appointment in [place].  Patient refused to have MRI of her knee, and reports she would rather have it as an outpatient and would rather follow-up with her previous orthopedics doctors at [facility].  Patient denies any new complaints today and is very eager to be discharged. Patient lives with her husband and she reports he is supportive and would assist her.    Hospital Course:  Active Problems:   Supratherapeutic INR   ETOH abuse   Fall at home, initial encounter   Renal transplant recipient   H/O mitral valve replacement   Medical non-compliance   Mechanical fall from possible alcohol intoxication Large right knee joint effusion Left femoral head avascular necrosis Trauma imaging showed left femoral head avascular necrosis, large right knee joint effusion without evidence of acute osseous abnormality. Right total hip arthroplasty without evidence of hardware complication Head CT with small left parietal scalp hematoma. Age advanced mild cerebral atrophy. Ortho consulted, recommendation for right knee MRI of which patient refused,  she is placed on knee immobilizer, with ACE wrap and provided with a walker. Wants to follow-up with [facility] orthopedics, advised to call and set up appointment (had right total hip arthroplasty  due to avascular necrosis as well in [facility]) Pain management, Fall precautions, PT-rec no follow up  Supratherapeutic INR Resolved, now subtherapeutic  Mechanical heart valve INR >10 on admission Noncompliant to follow-up S/p vitamin K Arnold Restart warfarin, bridge with Lovenox until therapeutic range Coumadin clinic set up on 03/22/20 to follow up INR  E. coli UTI Small leukocytes, many bacteria, WBC 21-50, large hemoglobin Urine culture grew >100,000 E. 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 EtOH level elevated on admission Reports Ativan causes hallucination, placed on Librium detox protocol Decreased dose of gabapentin to 600 mg BID from 1200 mg BID to prevent oversedation and subsequent falls Advised to quit alcohol, follow at AA  Normocytic anemia/?acute blood loss anemia Hemoglobin WNL on 12/2019, no recent labs since then On admission has been around 8 and remained stable Anemia panel showed iron 46, sats 22, folate 19.2, Vitamin B12 549 Follow up with PCP  Status post renal transplant Continue with CellCept and Prograf Follow up with nephrology  Depression Continue Lexapro, Wellbutrin  Tobacco abuse Advised to quit       Malnutrition Type:  Nutrition Problem: Increased nutrient needs Etiology: acute illness   Malnutrition Characteristics:  Signs/Symptoms: estimated needs   Nutrition Interventions:  Interventions: Prostat   Estimated body mass index is 20.12 kg/m as calculated from the following:   Height as of this encounter: 5\' 2"  (1.575 m).   Weight as of this encounter: 49.9 kg.    Procedures:  None  Consultations:  Orthopedics  Discharge Exam: BP 95/67 (BP Location: Right Arm)   Pulse (!) 106   Temp 99.8 F (37.7 C) (Oral)   Resp 16   Ht 5\' 2"  (1.575 m)   Wt 49.9 kg   LMP 03/15/2020    SpO2 98%   BMI 20.12 kg/m   General: NAD Cardiovascular: S1, S2 present Respiratory: CTAB    Discharge Instructions You were cared for by a hospitalist during your hospital stay. If you have any questions about your discharge medications or the care you received while you were in the hospital after you are discharged, you can call the unit and asked to speak with the hospitalist on call if the hospitalist that took care of you is not available. Once you are discharged, your primary care physician will handle any further medical issues. Please note that NO REFILLS for any discharge medications will be authorized once you are discharged, as it is imperative that you return to your primary care physician (or establish a relationship with a primary care physician if you do not have one) for your aftercare needs so that they can reassess your need for medications and monitor your lab values.  Discharge Instructions    Diet - low sodium heart healthy   Complete by: As directed    Increase activity slowly   Complete by: As directed      Allergies as of 03/19/2020      Reactions   Ibuprofen    Bad For Kidneys      Medication List    TAKE these medications   atorvastatin 20 MG tablet Commonly known as: LIPITOR Take 20 mg by mouth daily.   buPROPion  75 MG tablet Commonly known as: WELLBUTRIN Take 75 mg by mouth daily.   cephALEXin 500 MG capsule Commonly known as: Keflex Take 2 capsules (1,000 mg total) by mouth 2 (two) times daily for 3 days.   chlordiazePOXIDE 25 MG capsule Commonly known as: LIBRIUM Take 1 capsule (25 mg total) by mouth daily for 3 days.   enoxaparin 60 MG/0.6ML injection Commonly known as: LOVENOX Inject 0.5 mLs (50 mg total) into the skin 2 (two) times daily for 14 days.   escitalopram 20 MG tablet Commonly known as: LEXAPRO Take 20 mg by mouth daily.   folic acid 1 MG tablet Commonly known as: FOLVITE Take 1 tablet (1 mg total) by mouth daily. Start  taking on: March 20, 2020   gabapentin 300 MG capsule Commonly known as: NEURONTIN Take 2 capsules (600 mg total) by mouth 2 (two) times daily. What changed: how much to take   hydrOXYzine 50 MG capsule Commonly known as: VISTARIL Take 50 mg by mouth 3 (three) times daily as needed for anxiety.   methocarbamol 500 MG tablet Commonly known as: ROBAXIN Take 1 tablet (500 mg total) by mouth every 8 (eight) hours as needed for up to 3 days for muscle spasms.   mirtazapine 30 MG tablet Commonly known as: REMERON Take 30 mg by mouth at bedtime.   multivitamin with minerals Tabs tablet Take 1 tablet by mouth daily. Start taking on: March 20, 2020   mycophenolate 180 MG EC tablet Commonly known as: MYFORTIC Take 540 mg by mouth 2 (two) times daily. Take 3 tablets (540 mg) BID   oxyCODONE 5 MG immediate release tablet Commonly known as: Oxy IR/ROXICODONE Take 1 tablet (5 mg total) by mouth every 6 (six) hours as needed for up to 3 days for severe pain.   tacrolimus 1 MG capsule Commonly known as: PROGRAF Take 3 mg by mouth 2 (two) times daily. Take along with 0.5 capsule=3.5 mg   tacrolimus 0.5 MG capsule Commonly known as: PROGRAF Take 0.5 mg by mouth 2 (two) times daily. Take along with 3 mg capsule=3.5 mg   thiamine 100 MG tablet Take 1 tablet (100 mg total) by mouth daily. Start taking on: March 20, 2020   warfarin 10 MG tablet Commonly known as: COUMADIN Take 10 mg by mouth daily.            Durable Medical Equipment  (From admission, onward)         Start     Ordered   03/19/20 1512  For home use only DME Walker rolling  Once       Question Answer Comment  Walker: With 5 Inch Wheels   Patient needs a walker to treat with the following condition Fall      03/19/20 1511         Allergies  Allergen Reactions  . Ibuprofen     Bad For Kidneys    Follow-up Information    [facility]. Go on 03/22/2020.   Why: 9:30a-pt/inr  check Contact information: [address] [place] [address] [phone]               The results of significant diagnostics from this hospitalization (including imaging, microbiology, ancillary and laboratory) are listed below for reference.    Significant Diagnostic Studies: DG Chest 2 View  Result Date: 03/16/2020 CLINICAL DATA:  Anterior chest pain after fall 5 days ago. EXAM: CHEST - 2 VIEW COMPARISON:  None. FINDINGS: The heart  size and mediastinal contours are within normal limits. Prior mitral valve replacement. Normal pulmonary vascularity. No focal consolidation, pleural effusion, or pneumothorax. No acute osseous abnormality. Old bilateral clavicle fracture status post ORIF. IMPRESSION: No active cardiopulmonary disease. Electronically Signed   By: Obie Dredge M.D.   On: 03/16/2020 21:11   DG Lumbar Spine Complete  Result Date: 03/16/2020 CLINICAL DATA:  Back pain after fall 5 days ago. EXAM: LUMBAR SPINE - COMPLETE 4+ VIEW COMPARISON:  None. FINDINGS: Five lumbar type vertebral bodies. No acute fracture or subluxation. Vertebral body heights are preserved. Alignment is normal. Intervertebral disc spaces are maintained. The sacroiliac joints are unremarkable. IMPRESSION: Negative. Electronically Signed   By: Obie Dredge M.D.   On: 03/16/2020 21:12   DG Wrist Complete Right  Result Date: 03/16/2020 CLINICAL DATA:  Status post fall 5 days ago. EXAM: RIGHT WRIST - COMPLETE 3+ VIEW COMPARISON:  None. FINDINGS: There is no evidence of fracture or dislocation. There is no evidence of arthropathy or other focal bone abnormality. Soft tissues are unremarkable. IMPRESSION: Negative. Electronically Signed   By: Aram Candela M.D.   On: 03/16/2020 19:41   DG Tibia/Fibula Left  Result Date: 03/16/2020 CLINICAL DATA:  Left lower leg pain after fall 5 days ago. EXAM: LEFT TIBIA AND FIBULA - 2 VIEW COMPARISON:  None. FINDINGS: There is no evidence of fracture or other focal bone  lesions. Patchy sclerosis in the distal femur and proximal tibia consistent with chronic bone infarcts. Soft tissues are unremarkable. IMPRESSION: 1.  No acute osseous abnormality. Electronically Signed   By: Obie Dredge M.D.   On: 03/16/2020 21:14   DG Ankle Complete Left  Result Date: 03/16/2020 CLINICAL DATA:  Left ankle pain after fall 5 days ago. EXAM: LEFT ANKLE COMPLETE - 3+ VIEW COMPARISON:  None. FINDINGS: There is no evidence of fracture, dislocation, or joint effusion. There is no evidence of arthropathy or other focal bone abnormality. Soft tissues are unremarkable. IMPRESSION: Negative. Electronically Signed   By: Obie Dredge M.D.   On: 03/16/2020 21:13   CT Head Wo Contrast  Result Date: 03/16/2020 CLINICAL DATA:  Headache after fall 5 days ago. EXAM: CT HEAD WITHOUT CONTRAST TECHNIQUE: Contiguous axial images were obtained from the base of the skull through the vertex without intravenous contrast. COMPARISON:  None. FINDINGS: Brain: No evidence of acute infarction, hemorrhage, hydrocephalus, extra-axial collection or mass lesion/mass effect. Age advanced mild cerebral atrophy. Vascular: No hyperdense vessel or unexpected calcification. Skull: Normal. Negative for fracture or focal lesion. Sinuses/Orbits: No acute finding. Other: Small left parietal scalp hematoma. IMPRESSION: 1. No acute intracranial abnormality. Small left parietal scalp hematoma. 2. Age advanced mild cerebral atrophy. Electronically Signed   By: Obie Dredge M.D.   On: 03/16/2020 21:07   DG Knee Complete 4 Views Right  Result Date: 03/16/2020 CLINICAL DATA:  Status post fall 5 days ago. EXAM: RIGHT KNEE - COMPLETE 4+ VIEW COMPARISON:  None. FINDINGS: No evidence of an acute fracture or dislocation. No evidence of arthropathy. Benign appearing curvilinear sclerotic areas are seen within the medullary portion of the distal right femoral shaft. A large joint effusion is noted. IMPRESSION: 1. Large joint effusion  without evidence of acute osseous abnormality. Electronically Signed   By: Aram Candela M.D.   On: 03/16/2020 19:43   DG Hip Unilat W or Wo Pelvis 2-3 Views Right  Result Date: 03/16/2020 CLINICAL DATA:  Right hip pain.  Recent fall. EXAM: DG HIP (WITH OR WITHOUT PELVIS) 2-3V  RIGHT COMPARISON:  None. FINDINGS: No acute fracture or dislocation. Prior right total hip arthroplasty. No evidence of hardware failure or loosening. Subchondral sclerosis in the left femoral head. Soft tissues are unremarkable. IMPRESSION: 1. No acute osseous abnormality. 2. Prior right total hip arthroplasty without evidence of hardware complication. 3. Left femoral head avascular necrosis. Electronically Signed   By: Obie Dredge M.D.   On: 03/16/2020 21:09    Microbiology: Recent Results (from the past 240 hour(s))  SARS CORONAVIRUS 2 (TAT 6-24 HRS) Nasopharyngeal Nasopharyngeal Swab     Status: None   Collection Time: 03/16/20 11:31 PM   Specimen: Nasopharyngeal Swab  Result Value Ref Range Status   SARS Coronavirus 2 NEGATIVE NEGATIVE Final    Comment: (NOTE) SARS-CoV-2 target nucleic acids are NOT DETECTED.  The SARS-CoV-2 RNA is generally detectable in upper and lower respiratory specimens during the acute phase of infection. Negative results do not preclude SARS-CoV-2 infection, do not rule out co-infections with other pathogens, and should not be used as the sole basis for treatment or other patient management decisions. Negative results must be combined with clinical observations, patient history, and epidemiological information. The expected result is Negative.  Fact Sheet for Patients: HairSlick.no  Fact Sheet for Healthcare Providers: quierodirigir.com  This test is not yet approved or cleared by the [place] FDA and  has been authorized for detection and/or diagnosis of SARS-CoV-2 by FDA under an Emergency Use Authorization (EUA).  This EUA will remain  in effect (meaning this test can be used) for the duration of the COVID-19 declaration under Se ction 564(b)(1) of the Act, 21 U.S.C. section 360bbb-3(b)(1), unless the authorization is terminated or revoked sooner.  Performed at [facility], [address] [place], [place] [address]   Urine culture     Status: Abnormal   Collection Time: 03/16/20 11:35 PM   Specimen: Urine, Random  Result Value Ref Range Status   Specimen Description   Final    URINE, RANDOM Performed at [facility], [address] [place], [place] [address]    Special Requests   Final    NONE Performed at [facility], [address], [place], [place] [address]    Culture >=100,000 COLONIES/mL ESCHERICHIA COLI (A)  Final   Report Status 03/19/2020 FINAL  Final   Organism ID, Bacteria ESCHERICHIA COLI (A)  Final      Susceptibility   Escherichia coli - MIC*    AMPICILLIN >=32 RESISTANT Resistant     CEFAZOLIN <=4 SENSITIVE Sensitive     CEFEPIME <=0.12 SENSITIVE Sensitive     CEFTRIAXONE <=0.25 SENSITIVE Sensitive     CIPROFLOXACIN <=0.25 SENSITIVE Sensitive     GENTAMICIN <=1 SENSITIVE Sensitive     IMIPENEM <=0.25 SENSITIVE Sensitive     NITROFURANTOIN <=16 SENSITIVE Sensitive     TRIMETH/SULFA >=320 RESISTANT Resistant     AMPICILLIN/SULBACTAM 8 SENSITIVE Sensitive     PIP/TAZO <=4 SENSITIVE Sensitive     * >=100,000 COLONIES/mL ESCHERICHIA COLI     Labs: Basic Metabolic Panel: Recent Labs  Lab 03/16/20 2240 03/17/20 0136 03/17/20 0512 03/18/20 0501 03/19/20 0454  NA 138 137 137 133* 137  K 4.9 4.1 3.6 3.4* 4.2  CL 97* 97* 97* 91* 96*  CO2 GLUCOSE 96 141* 103* 97 133*  BUN CREATININE 0.90 0.77 0.61 0.81 0.86  CALCIUM 8.2* 8.2* 8.5* 9.8 9.7   Liver Function  Tests: Recent Labs  Lab 03/16/20 2240 03/17/20 0136 03/17/20 0512 03/18/20 0501 03/19/20 0454  AST 217* 188* 156* 116* 99*   ALT 81* 71* 70* 60* 57*  ALKPHOS 55 49 50 54 54  BILITOT 1.2 0.7 1.0 1.2 1.0  PROT 6.9 6.6 6.7 6.6 6.5  ALBUMIN 3.7 3.7 3.7 3.6 3.4*   No results for input(s): LIPASE, AMYLASE in the last 168 hours. No results for input(s): AMMONIA in the last 168 hours. CBC: Recent Labs  Lab 03/16/20 2240 03/17/20 0512 03/18/20 0501 03/19/20 0454  WBC 6.9 5.4 4.5 4.3  NEUTROABS 4.4 3.9 2.8 2.9  HGB 8.8* 8.1* 8.4* 8.2*  HCT 26.2* 24.6* 25.7* 25.2*  MCV 94.2 95.0 97.3 100.4*  PLT 152 99* 96* 93*   Cardiac Enzymes: Recent Labs  Lab 03/16/20 2240  CKTOTAL 283*   BNP: BNP (last 3 results) No results for input(s): BNP in the last 8760 hours.  ProBNP (last 3 results) No results for input(s): PROBNP in the last 8760 hours.  CBG: No results for input(s): GLUCAP in the last 168 hours.     Signed:  Briant CedarNkeiruka J Ezenduka, MD [facility] 03/19/2020, 3:29 PM

## 2020-03-19 NOTE — TOC Benefit Eligibility Note (Signed)
Transition of Care [facility]) Benefit Eligibility Note    Patient Details  Name: Leah Roth MRN: [id number] Date of Birth: 02/16/1980   Medication/Dose: ENOXAPARIN 60 MG BID X 7 DAYS  Covered?: Yes  Tier:  (NO TIER)  Prescription Coverage Preferred Pharmacy: [facility]  Spoke with Person/Company/Phone Number:: MATTHEW  @ [employer] #  [phone]  Co-Pay: $ 1.35  Prior Approval: No  Deductible: Met  Additional Notes: ENOXAPARIN 50 MG BID : Crecencio Mc Phone Number: 03/19/2020, 2:13 PM

## 2020-03-19 NOTE — Progress Notes (Signed)
MRI called and want the patient to know the MRI is getting repaired and it might take 2 more hours before her exam can be performed. Patient notified and requesting that it be completed outpatient. MD notified of request.

## 2020-03-19 NOTE — TOC Transition Note (Signed)
Transition of Care [facility]) - CM/SW Discharge Note   Patient Details  Name: Hally Colella MRN: [id number] Date of Birth: 1980/09/18  Transition of Care [facility]) CM/SW Contact:  Lanier Clam, RN Phone Number: 03/19/2020, 5:03 PM   Clinical Narrative:       Final next level of care: Home/Self Care Barriers to Discharge: No Barriers Identified   Patient Goals and CMS Choice Patient states their goals for this hospitalization and ongoing recovery are:: go home CMS Medicare.gov Compare Post Acute Care list provided to:: Patient    Discharge Placement                       Discharge Plan and Services   Discharge Planning Services: CM Consult              DME Agency:  ([employer] rep Vaughan Basta) Date DME Agency Contacted: 03/19/20 Time DME Agency Contacted: [phone] Representative spoke with at DME Agency: Vaughan Basta            Social Determinants of Health (SDOH) Interventions     Readmission Risk Interventions No flowsheet data found.

## 2020-03-19 NOTE — Progress Notes (Signed)
Physical Therapy Treatment Patient Details Name: Leah Roth MRN: [id number] DOB: [birth year] Today's Date: 03/19/2020    History of Present Illness Leah Roth is a 40 y.o. female with history of EtOH abuse,ESRD s/p live donor renal transplant 04/08/2013, s/p  mechanical fall down her apt stairs with head and face trauma and no LOC 5 days prior to admission.  She called EMS today as couldn't walk. She lives with husband. Doesn't have any children. Active smoker 2 ppd X2 20 years. Drinks a fifth of vodka daily. Prior cocaine use.    PT Comments    On arrival, pt in bed and reports just ambulating to sink to brush teeth with KI in place.  Pt requesting ace wrap.  Ace wrap recommended in ortho note, so applied ace wrap to right knee, and pt educated to monitor for numbness or decreased circulation (wrap too tight).  Pt ambulated again in room with only ace wrap and reports pain improved compared to when using only KI.  Pt in recliner end of session and provided with ice packs.  Pt reports d/c home today and would benefit from RW for pain control and steadying support.    Follow Up Recommendations  No PT follow up     Equipment Recommendations  Rolling walker with 5" wheels    Recommendations for Other Services       Precautions / Restrictions Precautions Precautions: Fall Required Braces or Orthoses: Knee Immobilizer - Right Knee Immobilizer - Right: Other (comment) (for comfort) Restrictions Other Position/Activity Restrictions: WBAT    Mobility  Bed Mobility Overal bed mobility: Modified Independent                  Transfers Overall transfer level: Needs assistance Equipment used: None Transfers: Sit to/from Stand Sit to Stand: Min guard;Supervision            Ambulation/Gait Ambulation/Gait assistance: Min guard Gait Distance (Feet): 30 Feet Assistive device: Rolling walker (2 wheeled) Gait Pattern/deviations: Decreased stance time - right;Antalgic;Step-to  pattern     General Gait Details: pt reliant on UE support, utilized Engineer, [employer] (Stroke Patients Only)       Balance                                            Cognition Arousal/Alertness: Awake/alert Behavior During Therapy: WFL for tasks assessed/performed Overall Cognitive Status: Within Functional Limits for tasks assessed                                        Exercises      General Comments        Pertinent Vitals/Pain Pain Assessment: 0-10 Pain Score: 6  Pain Location: right knee Pain Descriptors / Indicators: Sore Pain Intervention(s): Repositioned;Monitored during session    Home Living                      Prior Function            PT Goals (current goals can now be found in the care plan section) Progress towards PT goals: Progressing toward goals    Frequency    Min 3X/week  PT Plan Current plan remains appropriate;Equipment recommendations need to be updated    Co-evaluation              AM-PAC PT "6 Clicks" Mobility   Outcome Measure  Help needed turning from your back to your side while in a flat bed without using bedrails?: None Help needed moving from lying on your back to sitting on the side of a flat bed without using bedrails?: None Help needed moving to and from a bed to a chair (including a wheelchair)?: A Little Help needed standing up from a chair using your arms (e.g., wheelchair or bedside chair)?: A Little Help needed to walk in hospital room?: A Little Help needed climbing 3-5 steps with a railing? : A Little 6 Click Score: 20    End of Session   Activity Tolerance: Patient tolerated treatment well Patient left: in chair;with call bell/phone within reach   PT Visit Diagnosis: Unsteadiness on feet (R26.81);Difficulty in walking, not elsewhere classified (R26.2)     Time: 1453-1510 PT Time Calculation  (min) (ACUTE ONLY): 17 min  Charges:  $Gait Training: 8-22 mins                     Thomasene Mohair PT, DPT Acute Rehabilitation Services Pager: [phone] Office: [phone]  Maida Sale E 03/19/2020, 3:22 PM

## 2020-03-19 NOTE — TOC Transition Note (Addendum)
Transition of Care [facility]) - CM/SW Discharge Note   Patient Details  Name: Leah Roth MRN: [id number] Date of Birth: Oct 10, 1980  Transition of Care [facility]) CM/SW Contact:  Lanier Clam, RN Phone Number: 03/19/2020, 11:26 AM   Clinical Narrative:Referral for etoh resources-patient declines etoh resources-states she is active w/[employer],& has a good support system.Has pcp,pharmacy,own transport home. No CM needs.   12:30--received referral for coumadin clinc-set see f/u section of d/c. Will check for benfit cost of enoxaparin 50mg  sq bid-await benefit check.     Final next level of care: Home/Self Care Barriers to Discharge: No Barriers Identified   Patient Goals and CMS Choice Patient states their goals for this hospitalization and ongoing recovery are:: go home CMS Medicare.gov Compare Post Acute Care list provided to:: Patient    Discharge Placement                       Discharge Plan and Services   Discharge Planning Services: CM Consult                                 Social Determinants of Health (SDOH) Interventions     Readmission Risk Interventions No flowsheet data found.

## 2020-03-19 NOTE — TOC Transition Note (Signed)
Transition of Care [facility]) - CM/SW Discharge Note   Patient Details  Name: Leah Roth MRN: [id number] Date of Birth: [birth year]  Transition of Care [facility]) CM/SW Contact:  Lanier Clam, RN Phone Number: 03/19/2020, 4:54 PM   Clinical Narrative: Noted rw ordered.Patient has already left hospital, prior to receiving rw to rm-will have rw sent to patient's home-[employer] rep Jermaine aware to deliver to home.MD notified.    Final next level of care: Home/Self Care Barriers to Discharge: No Barriers Identified   Patient Goals and CMS Choice Patient states their goals for this hospitalization and ongoing recovery are:: go home CMS Medicare.gov Compare Post Acute Care list provided to:: Patient    Discharge Placement                       Discharge Plan and Services   Discharge Planning Services: CM Consult                                 Social Determinants of Health (SDOH) Interventions     Readmission Risk Interventions No flowsheet data found.

## 2020-03-19 NOTE — Care Management Important Message (Signed)
Important Message  Patient Details IM Letter given to the Patient. Name: Leah Roth MRN: [id number] Date of Birth: Jan 30, 1980   Medicare Important Message Given:  Yes     Caren Macadam 03/19/2020, 11:55 AM

## 2020-03-19 NOTE — Progress Notes (Signed)
ANTICOAGULATION CONSULT NOTE  Pharmacy Consult for warfarin Indication: mechanical valve  Allergies  Allergen Reactions  . Ibuprofen     Bad For Kidneys    Patient Measurements: Height: 5\' 2"  (157.5 cm) Weight: 49.9 kg (110 lb) IBW/kg (Calculated) : 50.1  Vital Signs: Temp: 99.7 F (37.6 C) (02/25 0534) Temp Source: Oral (02/25 0534) BP: 110/75 (02/25 0534) Pulse Rate: 112 (02/25 0534)  Labs: Recent Labs    03/16/20 2240 03/17/20 0130 03/17/20 0512 03/17/20 1811 03/18/20 0501 03/19/20 0454  HGB 8.8*  --  8.1*  --  8.4* 8.2*  HCT 26.2*  --  24.6*  --  25.7* 25.2*  PLT 152  --  99*  --  96* 93*  LABPROT 89.5*   < > 85.6* 41.4* 20.1* 15.3*  INR >10.0*   < > >10.0* 4.5* 1.8* 1.3*  CREATININE 0.90   < > 0.61  --  0.81 0.86  CKTOTAL 283*  --   --   --   --   --    < > = values in this interval not displayed.    Estimated Creatinine Clearance: 69.2 mL/min (by C-G formula based on SCr of 0.86 mg/dL).   Medical History: Past Medical History:  Diagnosis Date  . Alcohol abuse     Medications: Warfarin PTA  Assessment: Pt is a 37 yoF with PMH significant for renal transplant, EtOH abuse, and mechanical mitral valve replacement. Pt is prescribed warfarin PTA For mechanical mitral valve. Pt admitted s/p fall, found to have supratherapeutic INR. CT head: No acute intracranial abnormality, small left parietal scalp hematoma.   Warfarin previously managed by [facility] in [place], [place]. Spoke with clinic - reports patient was discharged from clinic due to non-compliance/no showing appointments. Reported most recently prescribed dose on record was: warfarin 10 mg MWF, 20 mg TTSS.   INR on admission >10 Last dose reported: 03/15/20  Significant Events:  -2/23 Vitamin K 5 mg subQ x1 given at 0001  Today, 03/19/20  INR = 1.3 remains low as expected after vitamin K administration; bridging with enoxaparin per MD  Pt does have bruising s/p fall, but no signs  of active bleeding  CBC: Hgb (8.2) Plt (93) - remain low but stable  Diet: Heart healthy; 100% of meal charted  DDI: None significant. Pt on antibiotics which have potential to enhance effects of warfarin  Goal of Therapy:  INR 2.5 - 3.5 Monitor platelets by anticoagulation protocol: Yes   Plan:   Warfarin 10 mg PO today - given elevated INR on admission and acute illness, avoiding boosted doses until warfarin responsiveness more apparent  Continue Lovenox 1 mg/kg SQ q12 hr while INR low  INR daily inpatient, CBC at least q48 given low Plt  Patient will require anticoagulation bridge while INR subtherapeutic due to mechanical valve.  Due to history of non-compliance and elevated INR on admission, at this time would recommend therapeutic LMWH + warfarin 10 mg PO Daily on discharge with INR check within 72 hours to guide further dosing.   03/21/20, PharmD 03/19/2020,12:02 PM

## 2020-03-19 NOTE — Progress Notes (Signed)
Orthopedic Tech Progress Note Patient Details:  Leah Roth 02/06/1980 [id number]  Ortho Devices Type of Ortho Device: Knee Immobilizer Ortho Device/Splint Location: RLE Ortho Device/Splint Interventions: Ordered,Application   Post Interventions Patient Tolerated: Well Instructions Provided: Care of device   Maurene Capes 03/19/2020, 12:17 PM

## 2020-03-23 ENCOUNTER — Ambulatory Visit: Payer: Medicare Other

## 2022-07-31 IMAGING — CR DG LUMBAR SPINE COMPLETE 4+V
5 series · 5 of 5 positions shown · non-contrast
Comparison: None.

CLINICAL DATA: Back pain after fall 5 days ago.

EXAM:
LUMBAR SPINE - COMPLETE 4+ VIEW

[t lumbar spine ap]
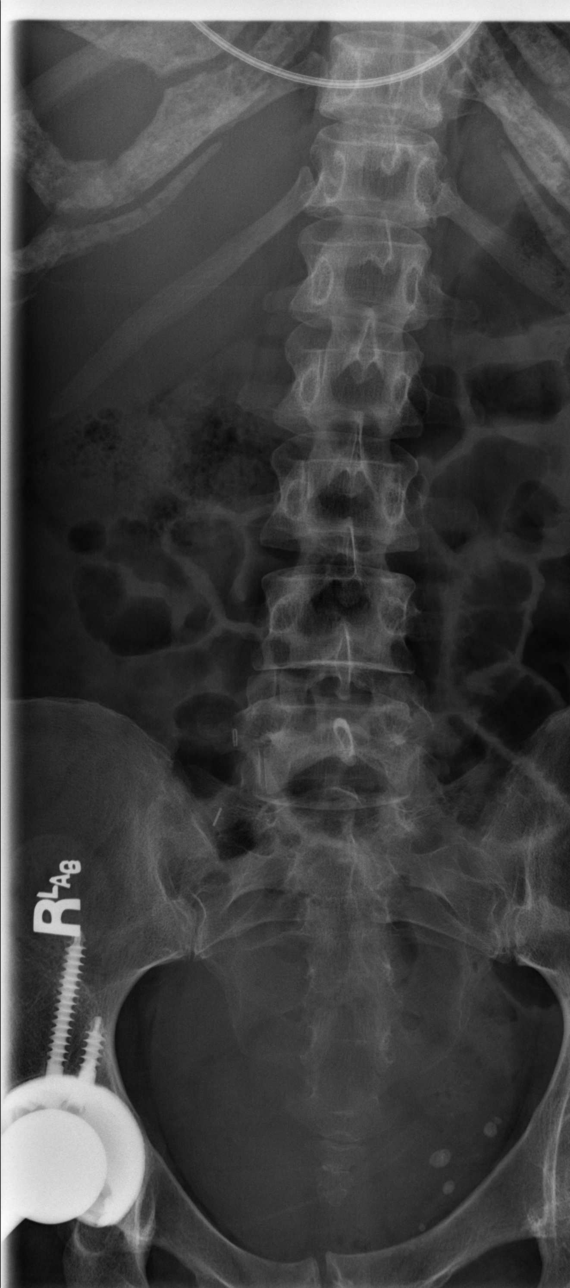

[t lumbar spine obl (1 of 2)]
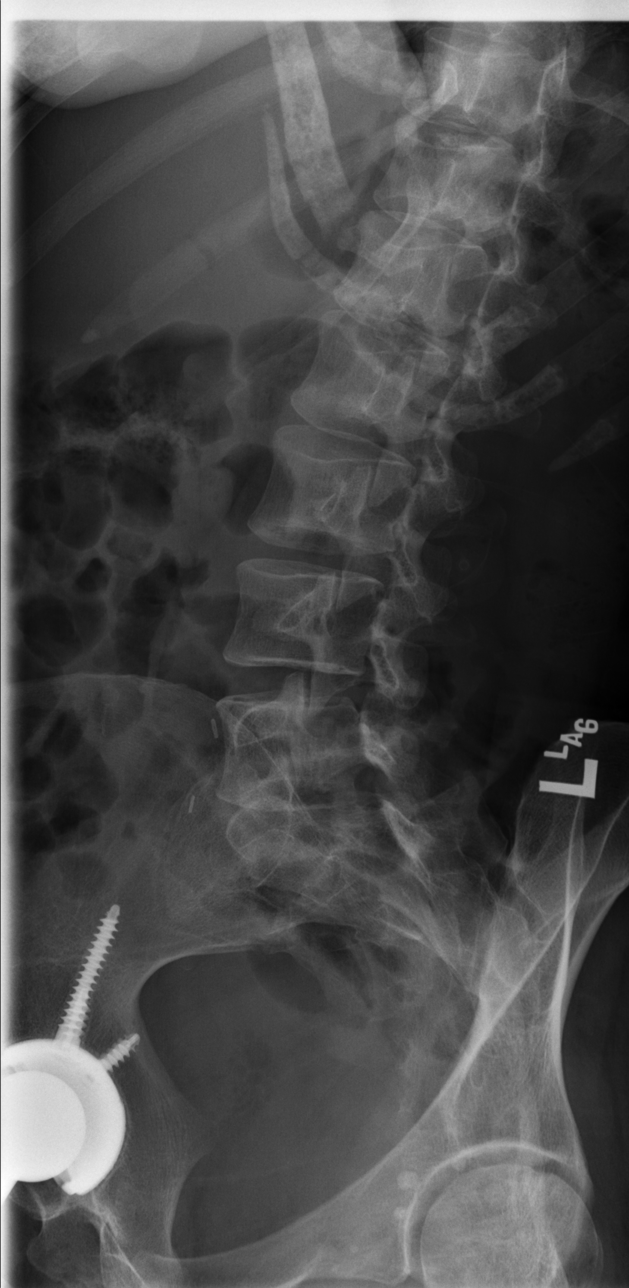

[t lumbar spine obl (2 of 2)]
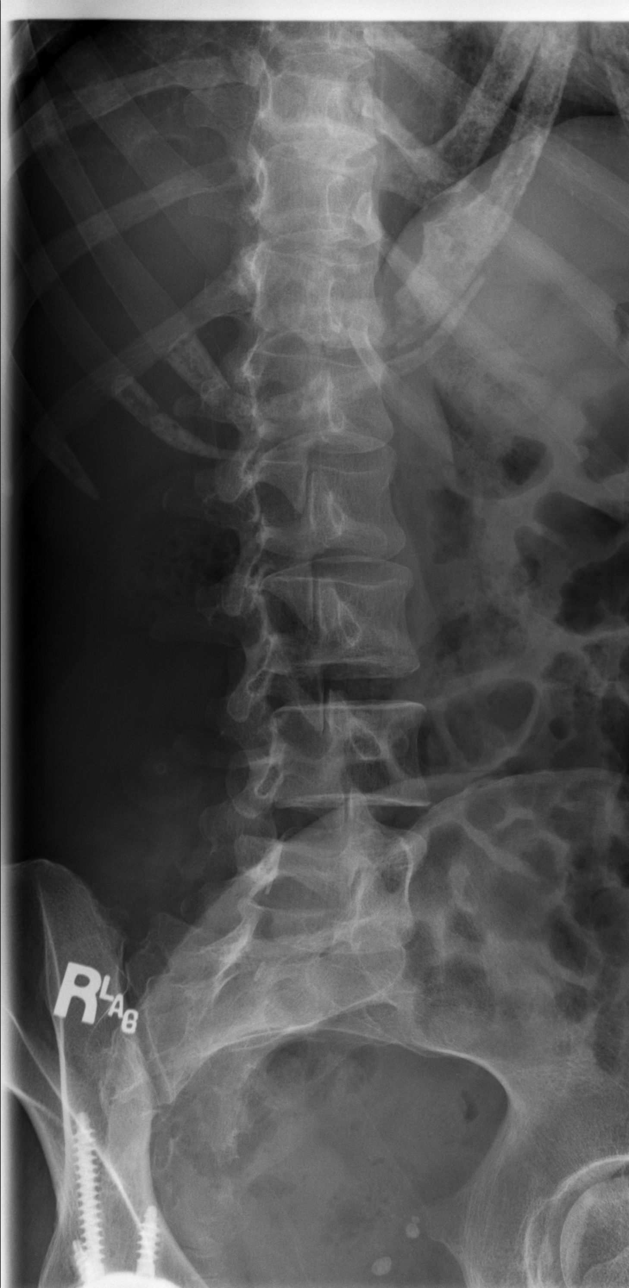

[t lumbar spine lat]
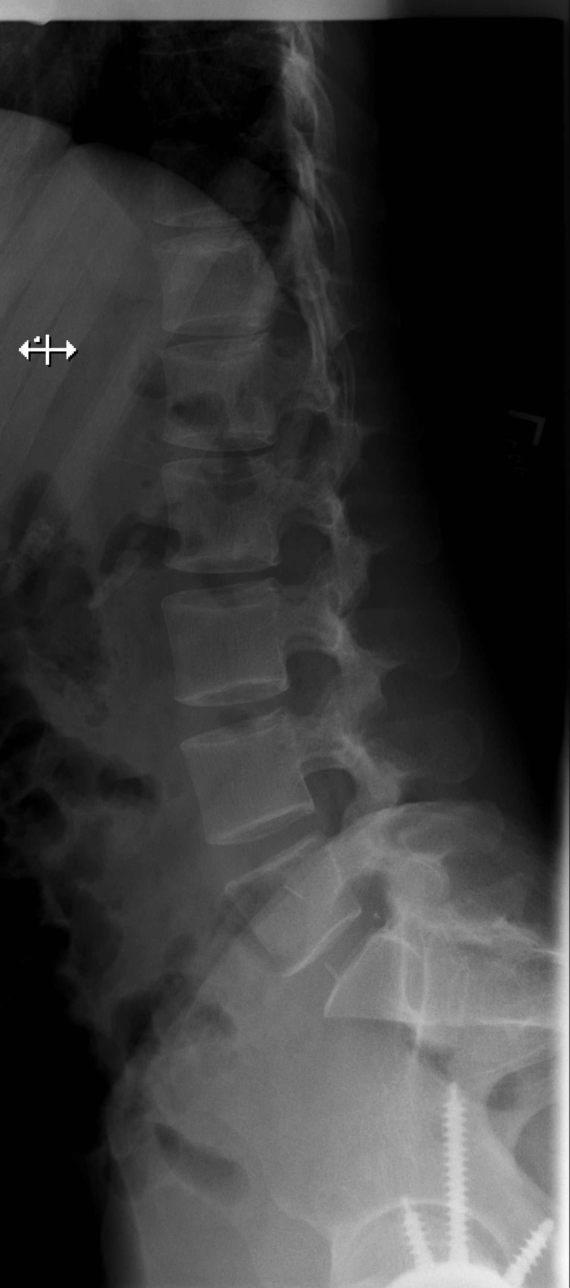

[t lumbar l-5 s-1 spot]
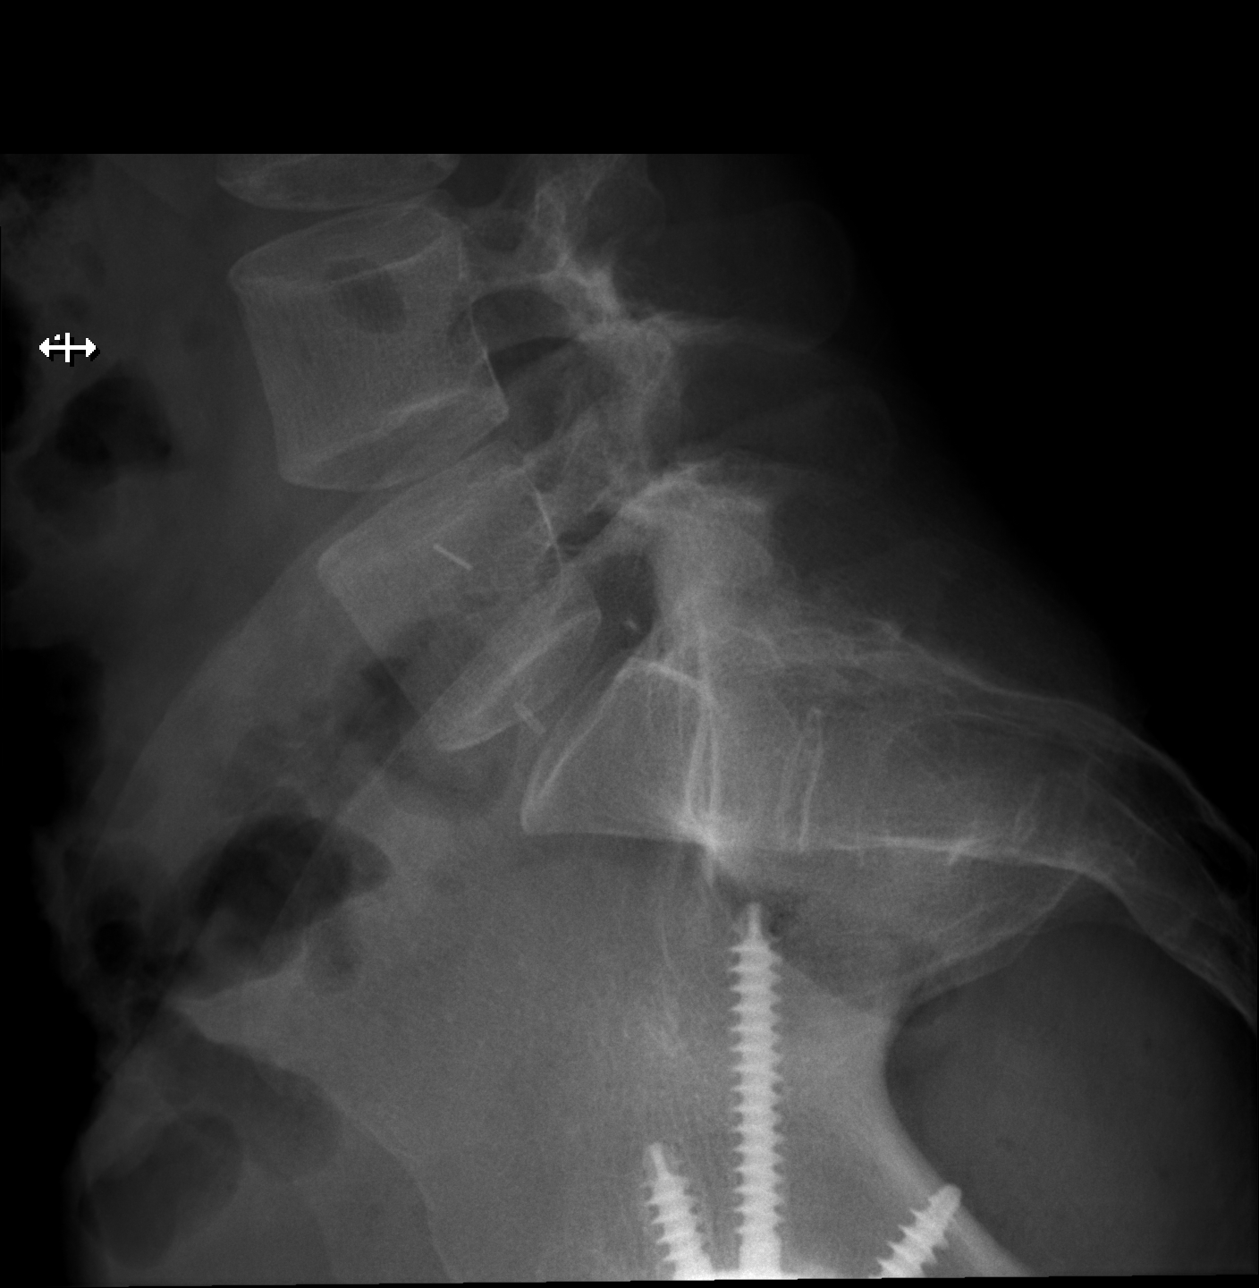

[5 of 5 positions shown; findings below may reference images not displayed]

FINDINGS: Five lumbar type vertebral bodies.

No acute fracture or subluxation. Vertebral body heights are
preserved.

Alignment is normal.

Intervertebral disc spaces are maintained.

The sacroiliac joints are unremarkable.
IMPRESSION: Negative.

## 2022-07-31 IMAGING — CT CT HEAD W/O CM
3 series · 15 of 47 positions shown, 18 images · non-contrast
Comparison: None.

CLINICAL DATA: Headache after fall 5 days ago.

EXAM:
CT HEAD WITHOUT CONTRAST
TECHNIQUE: Contiguous axial images were obtained from the base of the skull
through the vertex without intravenous contrast.

[Series 2: head wo · axial · 0.47mm/px · z∈[+1225,+1350]mm · 9 of 30 slices shown, 12 images]
[im 3/30  brain]
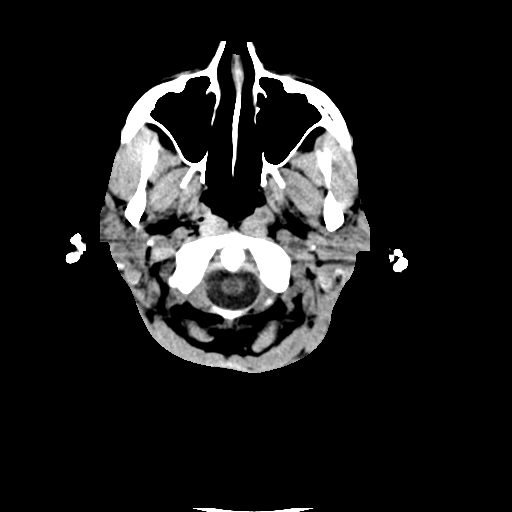
[im 3/30  bone]
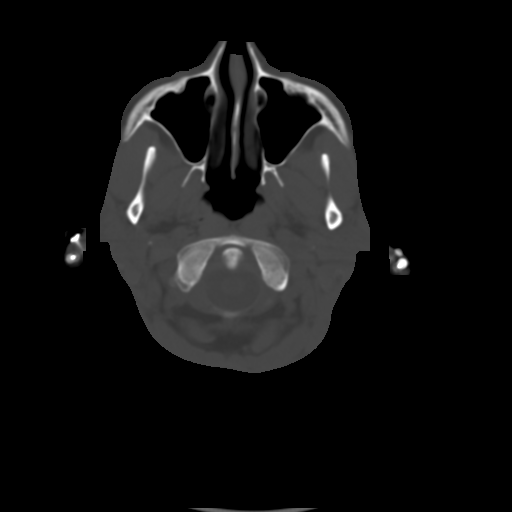
[im 6/30  brain]
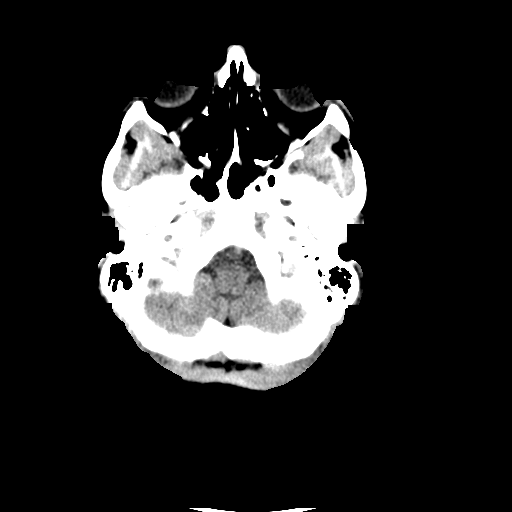
[im 9/30  brain]
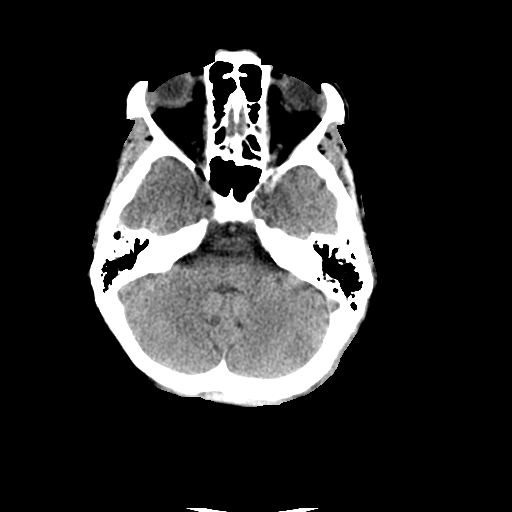
[im 12/30  brain]
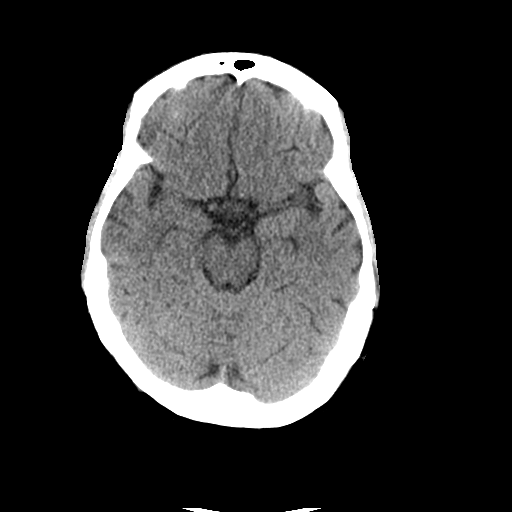
[im 16/30  brain]
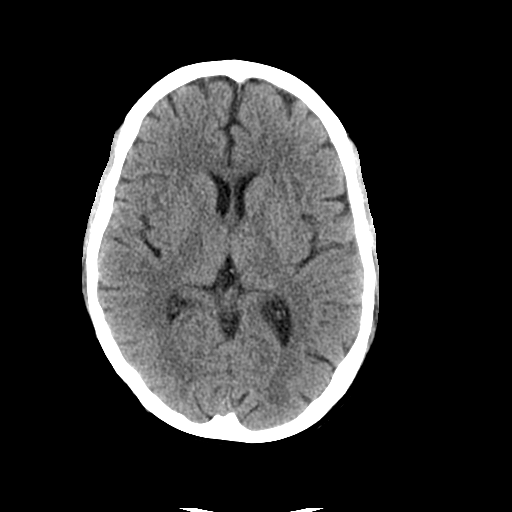
[im 16/30  bone]
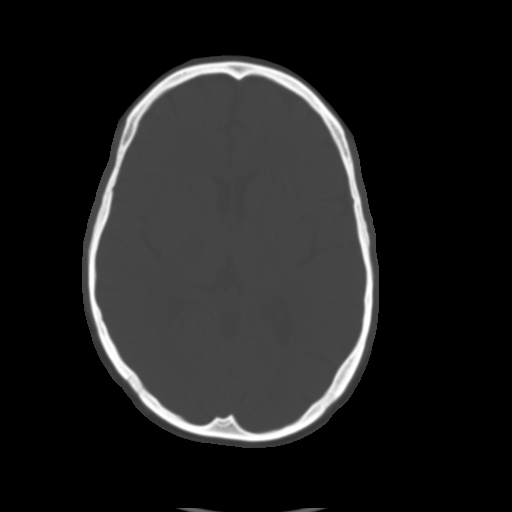
[im 19/30  brain]
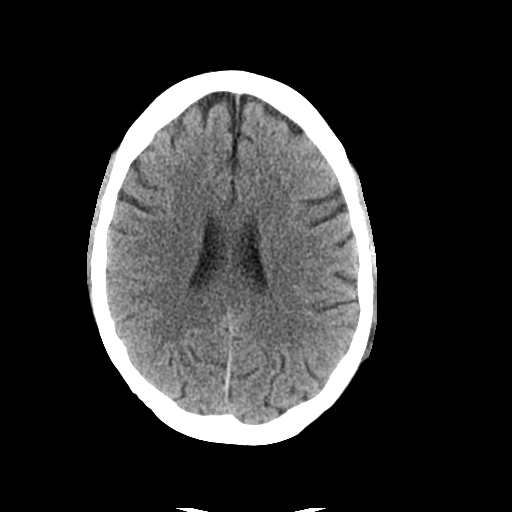
[im 22/30  brain]
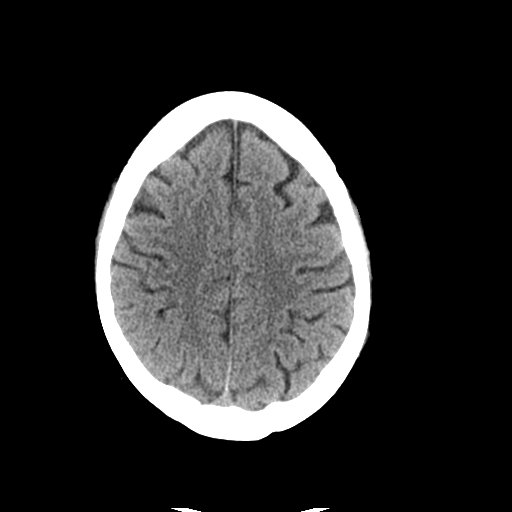
[im 25/30  brain]
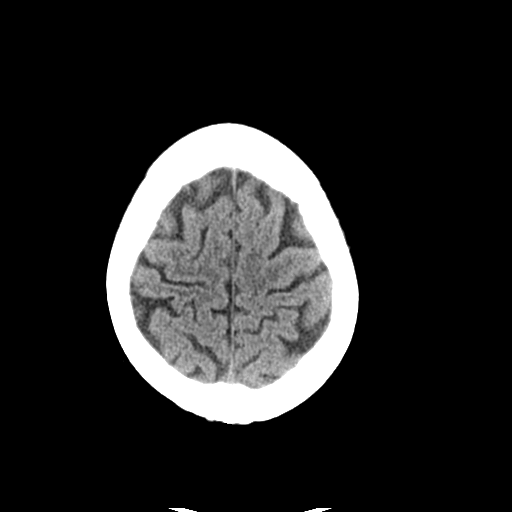
[im 28/30  brain]
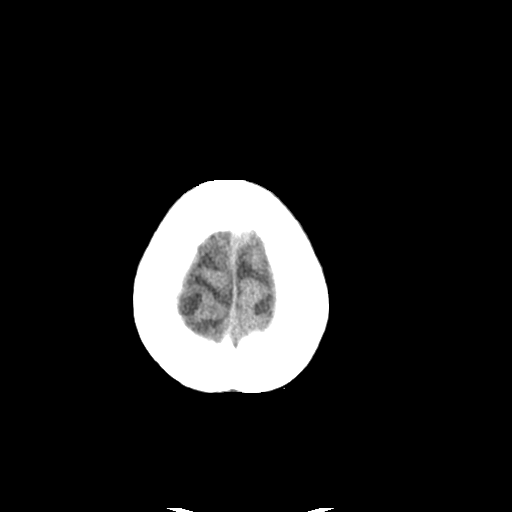
[im 28/30  bone]
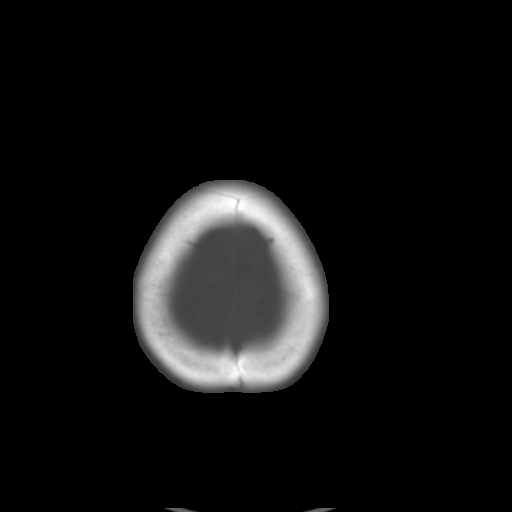

[Series 4: coronal soft tissue · coronal · 0.30mm/px · 3 of 68 slices shown]
[im 23/68  brain]
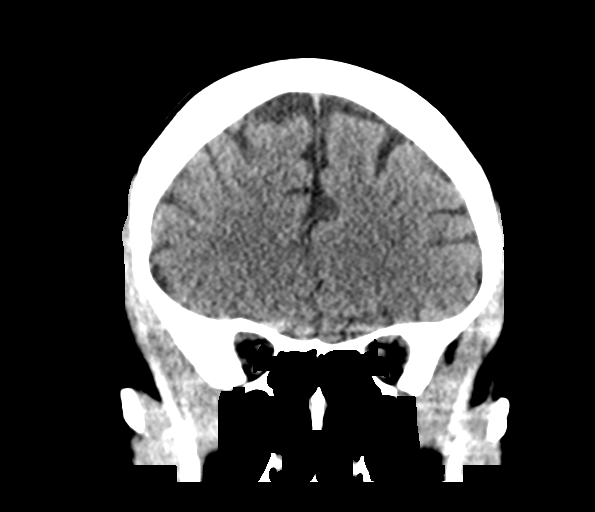
[im 30/68  brain]
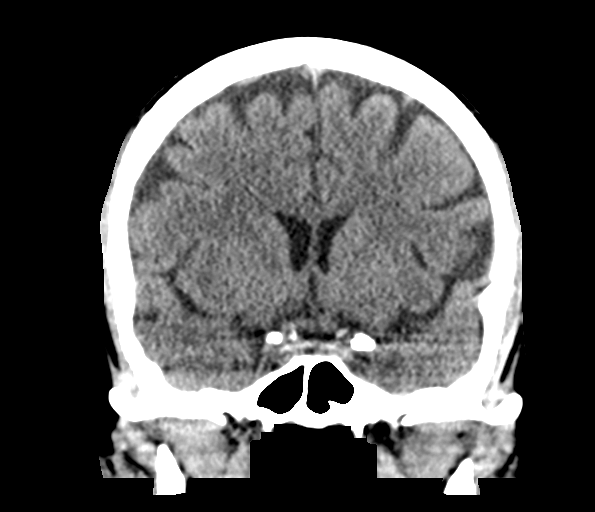
[im 38/68  brain]
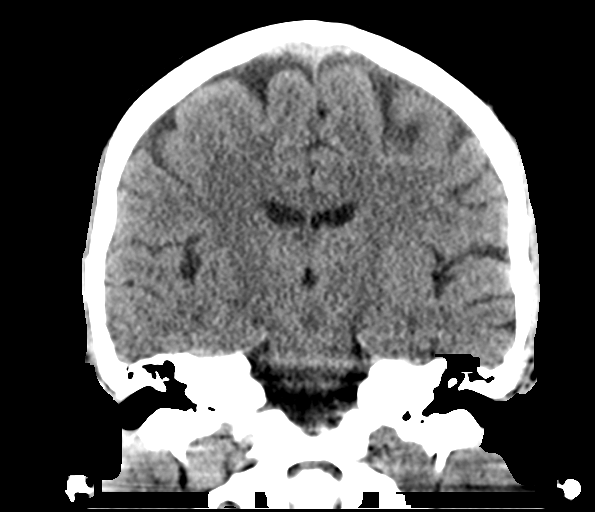

[Series 5: sagittal soft tissue · sagittal · 0.32mm/px · 3 of 56 slices shown]
[im 19/56  brain]
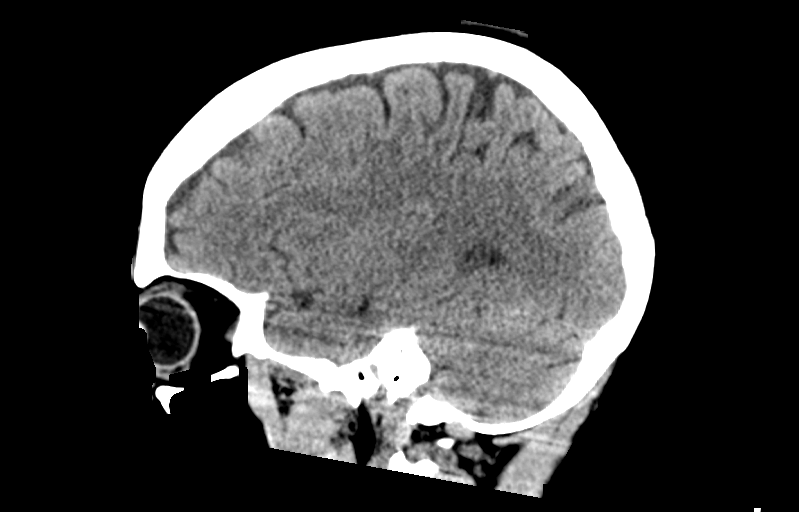
[im 28/56  brain]
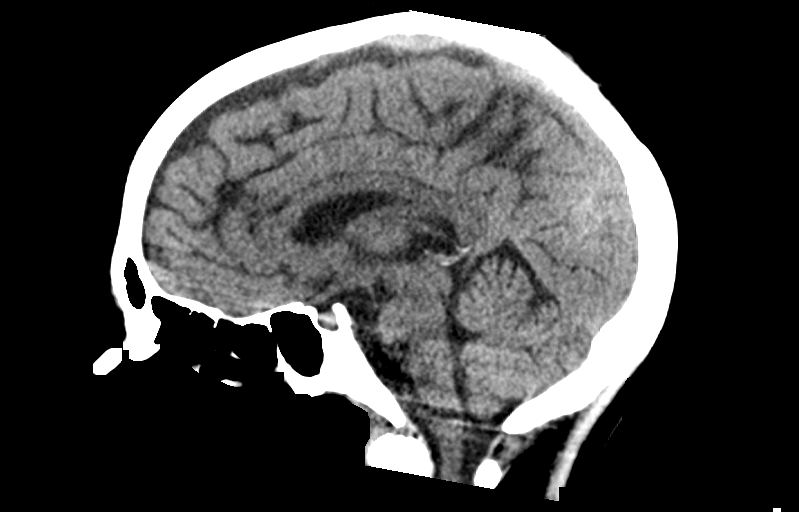
[im 37/56  brain]
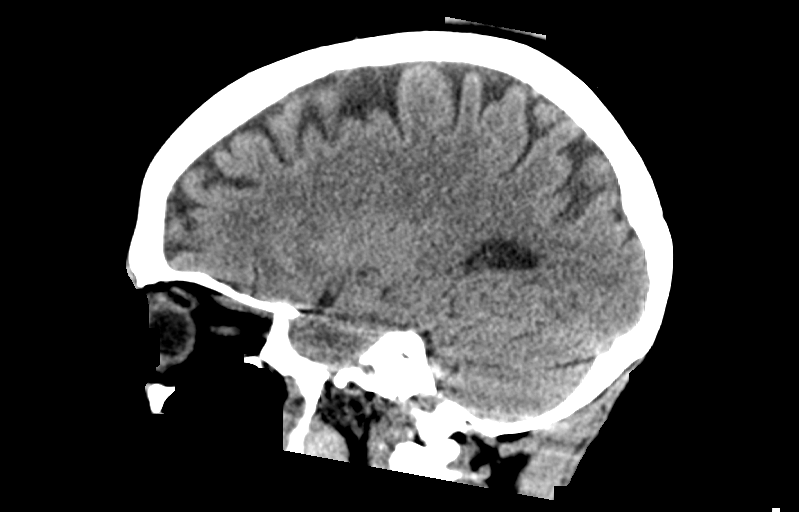

[15 of 47 positions shown; findings below may reference images not displayed]

FINDINGS: Brain: No evidence of acute infarction, hemorrhage, hydrocephalus,
extra-axial collection or mass lesion/mass effect. Age advanced mild
cerebral atrophy.

Vascular: No hyperdense vessel or unexpected calcification.

Skull: Normal. Negative for fracture or focal lesion.

Sinuses/Orbits: No acute finding.

Other: Small left parietal scalp hematoma.
IMPRESSION: 1. No acute intracranial abnormality. Small left parietal scalp
hematoma.
2. Age advanced mild cerebral atrophy.
